# Patient Record
Sex: Female | Born: 1975 | Race: White | Hispanic: No | Smoking: Current every day smoker
Health system: Southern US, Community
[De-identification: ages and names within clinical notes are randomized; demographics above are authoritative.]

## PROBLEM LIST (undated history)

## (undated) DIAGNOSIS — N809 Endometriosis, unspecified: Secondary | ICD-10-CM

## (undated) DIAGNOSIS — F17200 Nicotine dependence, unspecified, uncomplicated: Secondary | ICD-10-CM

## (undated) DIAGNOSIS — N3010 Interstitial cystitis (chronic) without hematuria: Secondary | ICD-10-CM

## (undated) DIAGNOSIS — K589 Irritable bowel syndrome without diarrhea: Secondary | ICD-10-CM

## (undated) DIAGNOSIS — M797 Fibromyalgia: Secondary | ICD-10-CM

## (undated) HISTORY — PX: OTHER SURGICAL HISTORY: SHX169

## (undated) HISTORY — PX: TUBAL LIGATION: SHX77

---

## 2008-01-11 ENCOUNTER — Emergency Department (HOSPITAL_COMMUNITY): Admission: EM | Admit: 2008-01-11 | Discharge: 2008-01-11 | Payer: Self-pay | Admitting: Emergency Medicine

## 2008-01-13 ENCOUNTER — Emergency Department: Payer: Self-pay | Admitting: Emergency Medicine

## 2008-02-27 ENCOUNTER — Emergency Department: Payer: Self-pay | Admitting: Emergency Medicine

## 2008-03-13 ENCOUNTER — Emergency Department (HOSPITAL_COMMUNITY): Admission: EM | Admit: 2008-03-13 | Discharge: 2008-03-13 | Payer: Self-pay | Admitting: Emergency Medicine

## 2014-07-28 ENCOUNTER — Emergency Department (HOSPITAL_COMMUNITY)
Admission: EM | Admit: 2014-07-28 | Discharge: 2014-07-29 | Disposition: A | Discharge status: Home / Self Care | Payer: Self-pay | Attending: Emergency Medicine | Admitting: Emergency Medicine

## 2014-07-28 DIAGNOSIS — S199XXA Unspecified injury of neck, initial encounter: Secondary | ICD-10-CM | POA: Insufficient documentation

## 2014-07-28 DIAGNOSIS — S0101XA Laceration without foreign body of scalp, initial encounter: Secondary | ICD-10-CM | POA: Insufficient documentation

## 2014-07-28 DIAGNOSIS — S4992XA Unspecified injury of left shoulder and upper arm, initial encounter: Secondary | ICD-10-CM | POA: Insufficient documentation

## 2014-07-28 DIAGNOSIS — S060X1A Concussion with loss of consciousness of 30 minutes or less, initial encounter: Secondary | ICD-10-CM | POA: Insufficient documentation

## 2014-07-28 DIAGNOSIS — Z720 Tobacco use: Secondary | ICD-10-CM | POA: Insufficient documentation

## 2014-07-28 DIAGNOSIS — T7491XA Unspecified adult maltreatment, confirmed, initial encounter: Secondary | ICD-10-CM
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
## 2014-07-29 ENCOUNTER — Emergency Department (HOSPITAL_COMMUNITY): Payer: Self-pay

## 2014-07-29 ENCOUNTER — Encounter (HOSPITAL_COMMUNITY): Payer: Self-pay | Admitting: Emergency Medicine

## 2014-07-29 MED ORDER — LIDOCAINE-EPINEPHRINE 1 %-1:100000 IJ SOLN
10.0000 mL | Freq: Once | INTRAMUSCULAR | Status: DC
  Filled 2014-07-29: qty 1

## 2014-07-29 MED ORDER — CYCLOBENZAPRINE HCL 10 MG PO TABS: 10.0000 mg | ORAL_TABLET | Freq: Two times a day (BID) | ORAL | Status: DC | PRN

## 2014-07-29 MED ORDER — PROMETHAZINE HCL 25 MG PO TABS: 25.0000 mg | ORAL_TABLET | Freq: Four times a day (QID) | ORAL | Status: DC | PRN

## 2014-07-29 MED ORDER — HYDROCODONE-ACETAMINOPHEN 5-325 MG PO TABS: 2.0000 | ORAL_TABLET | ORAL | Status: DC | PRN

## 2014-07-29 MED ORDER — HYDROCODONE-ACETAMINOPHEN 5-325 MG PO TABS
2.0000 | ORAL_TABLET | Freq: Once | ORAL | Status: AC
  Administered 2014-07-29: 2 via ORAL
  Filled 2014-07-29: qty 2

## 2014-07-29 NOTE — Discharge Instructions (Signed)
Take Vicodin as needed for pain. Take flexeril as needed for muscle spasm. Take phenergan as needed for nausea. You may take these medications together. Refer to attached documents for more information. Return to the ED in 5 days for staple removal.
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
## 2014-07-29 NOTE — ED Notes (Signed)
PA at bedside.

## 2014-07-29 NOTE — ED Provider Notes (Signed)
Medical screening examination/treatment/procedure(s) were performed by non-physician practitioner and as supervising physician I was immediately available for consultation/collaboration.    Linwood DibblesJon Wise Fees, MD 07/29/14 [phone]

## 2014-08-15 ENCOUNTER — Encounter (HOSPITAL_COMMUNITY): Payer: Self-pay

## 2014-08-15 ENCOUNTER — Emergency Department (HOSPITAL_COMMUNITY)
Admission: EM | Admit: 2014-08-15 | Discharge: 2014-08-15 | Disposition: A | Discharge status: Home / Self Care | Payer: Self-pay | Attending: Emergency Medicine | Admitting: Emergency Medicine

## 2014-08-15 DIAGNOSIS — Z720 Tobacco use: Secondary | ICD-10-CM | POA: Insufficient documentation

## 2014-08-15 DIAGNOSIS — Z4802 Encounter for removal of sutures: Secondary | ICD-10-CM | POA: Insufficient documentation

## 2014-08-15 NOTE — ED Notes (Addendum)
Pt presents needing staples removed from posterior head.  Pain score 2/10.  Pt reports having staples placed on 10/15.

## 2014-08-15 NOTE — Discharge Instructions (Signed)
Sutures removed without difficulty.  Keep area clean and dry. Return to the ED for new or worsening symptoms.

## 2014-08-15 NOTE — ED Provider Notes (Signed)
CSN: [id number]     Arrival date & time 08/15/14  1605 History  This chart was scribed for non-physician practitioner Sharilyn SitesLisa [place] Droll working with Linwood DibblesJon Knapp, MD by Littie Deedsichard Sun, ED Scribe. This patient was seen in room WTR8/WTR8 and the patient's care was started at 4:19 PM.        Chief Complaint  Patient presents with  . Suture / Staple Removal     The history is provided by the patient. No language interpreter was used.   HPI Comments: Kristin Patterson is a 38 y.o. female who presents to the Emergency Department for staple removal from posterior head. Patient reports having staples placed on 07/28/14. They were supposed to be removed 5 days after placement, but she was unable to find a ride to the ED. No drainage or fever. She removed one of the staples herself PTA.  No current complaints other than itching at wound site.  History reviewed. No pertinent past medical history. History reviewed. No pertinent past surgical history. History reviewed. No pertinent family history. History  Substance Use Topics  . Smoking status: Current Every Day Smoker  . Smokeless tobacco: Not on file  . Alcohol Use: No   OB History    No data available     Review of Systems  Constitutional: Negative for fever.  Skin: Positive for wound.  All other systems reviewed and are negative.     Allergies  Review of patient's allergies indicates no known allergies.  Home Medications   Prior to Admission medications   Medication Sig Start Date End Date Taking? Authorizing Provider  Acetaminophen-Caff-Pyrilamine (MIDOL COMPLETE) 500-60-15 MG TABS Take 2 capsules by mouth every 4 (four) hours as needed (for period pain.).    Historical Provider, MD  Aspirin-Salicylamide-Caffeine (BC HEADACHE POWDER PO) Take 1 packet by mouth every 4 (four) hours as needed (for pain).    Historical Provider, MD  cyclobenzaprine (FLEXERIL) 10 MG tablet Take 1 tablet (10 mg total) by mouth 2 (two) times daily as needed for  muscle spasms. 07/29/14   Emilia BeckKaitlyn Szekalski, PA-C  HYDROcodone-acetaminophen (NORCO/VICODIN) 5-325 MG per tablet Take 2 tablets by mouth every 4 (four) hours as needed for moderate pain or severe pain. 07/29/14   Kaitlyn Szekalski, PA-C  ibuprofen (ADVIL,MOTRIN) 200 MG tablet Take 600-800 mg by mouth every 6 (six) hours as needed (for period pain/headaches).    Historical Provider, MD  promethazine (PHENERGAN) 25 MG tablet Take 1 tablet (25 mg total) by mouth every 6 (six) hours as needed for nausea or vomiting. 07/29/14   Kaitlyn Szekalski, PA-C   BP 115/97 mmHg  Pulse 83  Temp(Src) 98.3 F (36.8 C) (Oral)  Resp 16  SpO2 99%  LMP 08/14/2014   Physical Exam  Constitutional: She is oriented to person, place, and time. She appears well-developed and well-nourished. No distress.  HENT:  Head: Normocephalic.  Mouth/Throat: Oropharynx is clear and moist. No oropharyngeal exudate.  3 staples present in top of scalp (patient removed one herself prior to arrival in ED); wound appears clean without signs of infection  Eyes: Pupils are equal, round, and reactive to light.  Neck: Neck supple.  Cardiovascular: Normal rate.   Pulmonary/Chest: Effort normal and breath sounds normal. No respiratory distress. She has no wheezes.  Musculoskeletal: She exhibits no edema.  Neurological: She is alert and oriented to person, place, and time. No cranial nerve deficit.  Skin: Skin is warm and dry. No rash noted.  Psychiatric: She has a normal mood and  affect. Her behavior is normal.  Nursing note and vitals reviewed.   ED Course  Procedures  DIAGNOSTIC STUDIES: Oxygen Saturation is 99% on room air, normal by my interpretation.    COORDINATION OF CARE: 4:20 PM-Discussed treatment plan which includes staple removal with pt at bedside and pt agreed to plan.   SUTURE REMOVAL Performed by: PA Student Carys under my direct supervision Authorized by: Sharilyn SitesLisa Malaiya Paczkowski Consent: Verbal consent obtained. Consent  given by: patient Required items: required blood products, implants, devices, and special equipment available  Time out: Immediately prior to procedure a "time out" was called to verify the correct patient, procedure, equipment, support staff and site/side marked as required. Location: scalp Wound Appearance: clean Staples Removed: 3 (1 had been removed PTA) Post-removal: abx ointment Patient tolerance: Patient tolerated the procedure well with no immediate complications.   Labs Review Labs Reviewed - No data to display  Imaging Review No results found.   EKG Interpretation None      MDM   Final diagnoses:  Encounter for staple removal   3 staples in place, patient removed one herself prior to arrival.  Wound appears clean without signs of infection.  Sutures removed without difficulty, patient tolerated well. Will continue home pain medications as needed.  Discussed plan with patient, he/she acknowledged understanding and agreed with plan of care.  Return precautions given for new or worsening symptoms.  I personally performed the services described in this documentation, which was scribed in my presence. The recorded information has been reviewed and is accurate.  Garlon HatchetLisa M Alakai Macbride, PA-C 08/15/14 1732

## 2014-10-30 ENCOUNTER — Inpatient Hospital Stay (HOSPITAL_COMMUNITY)
Admission: EM | Admit: 2014-10-30 | Discharge: 2014-11-02 | DRG: 690 | Disposition: A | Discharge status: Home / Self Care | Payer: Self-pay | Attending: Family Medicine | Admitting: Family Medicine

## 2014-10-30 ENCOUNTER — Inpatient Hospital Stay (HOSPITAL_COMMUNITY): Payer: Self-pay

## 2014-10-30 ENCOUNTER — Encounter (HOSPITAL_COMMUNITY): Payer: Self-pay | Admitting: Emergency Medicine

## 2014-10-30 ENCOUNTER — Emergency Department (HOSPITAL_COMMUNITY): Payer: Self-pay

## 2014-10-30 DIAGNOSIS — N390 Urinary tract infection, site not specified: Secondary | ICD-10-CM | POA: Diagnosis present

## 2014-10-30 DIAGNOSIS — E876 Hypokalemia: Secondary | ICD-10-CM | POA: Diagnosis present

## 2014-10-30 DIAGNOSIS — F17210 Nicotine dependence, cigarettes, uncomplicated: Secondary | ICD-10-CM | POA: Diagnosis present

## 2014-10-30 DIAGNOSIS — R1013 Epigastric pain: Secondary | ICD-10-CM | POA: Diagnosis present

## 2014-10-30 DIAGNOSIS — D72829 Elevated white blood cell count, unspecified: Secondary | ICD-10-CM | POA: Diagnosis present

## 2014-10-30 DIAGNOSIS — N10 Acute tubulo-interstitial nephritis: Principal | ICD-10-CM | POA: Diagnosis present

## 2014-10-30 DIAGNOSIS — R6889 Other general symptoms and signs: Secondary | ICD-10-CM | POA: Diagnosis present

## 2014-10-30 DIAGNOSIS — N12 Tubulo-interstitial nephritis, not specified as acute or chronic: Secondary | ICD-10-CM

## 2014-10-30 DIAGNOSIS — M797 Fibromyalgia: Secondary | ICD-10-CM | POA: Diagnosis present

## 2014-10-30 DIAGNOSIS — R1084 Generalized abdominal pain: Secondary | ICD-10-CM

## 2014-10-30 DIAGNOSIS — F17200 Nicotine dependence, unspecified, uncomplicated: Secondary | ICD-10-CM | POA: Diagnosis present

## 2014-10-30 DIAGNOSIS — D649 Anemia, unspecified: Secondary | ICD-10-CM | POA: Clinically undetermined

## 2014-10-30 DIAGNOSIS — R1010 Upper abdominal pain, unspecified: Secondary | ICD-10-CM

## 2014-10-30 DIAGNOSIS — B9620 Unspecified Escherichia coli [E. coli] as the cause of diseases classified elsewhere: Secondary | ICD-10-CM | POA: Diagnosis present

## 2014-10-30 HISTORY — DX: Endometriosis, unspecified: N80.9

## 2014-10-30 HISTORY — DX: Nicotine dependence, unspecified, uncomplicated: F17.200

## 2014-10-30 HISTORY — DX: Fibromyalgia: M79.7

## 2014-10-30 HISTORY — DX: Interstitial cystitis (chronic) without hematuria: N30.10

## 2014-10-30 HISTORY — DX: Irritable bowel syndrome without diarrhea: K58.9

## 2014-10-30 LAB — URINE MICROSCOPIC-ADD ON

## 2014-10-30 LAB — I-STAT CHEM 8, ED
BUN: 15 mg/dL (ref 6–23)
CHLORIDE: 102 meq/L (ref 96–112)
CREATININE: 0.7 mg/dL (ref 0.50–1.10)
Calcium, Ion: 0.96 mmol/L — ABNORMAL LOW (ref 1.12–1.23)
GLUCOSE: 107 mg/dL — AB (ref 70–99)
HCT: 38 % (ref 36.0–46.0)
Hemoglobin: 12.9 g/dL (ref 12.0–15.0)
Potassium: 3 mmol/L — ABNORMAL LOW (ref 3.5–5.1)
Sodium: 138 mmol/L (ref 135–145)
TCO2: 19 mmol/L (ref 0–100)

## 2014-10-30 LAB — RAPID URINE DRUG SCREEN, HOSP PERFORMED
Amphetamines: NOT DETECTED
BENZODIAZEPINES: NOT DETECTED
Barbiturates: NOT DETECTED
COCAINE: NOT DETECTED
Opiates: POSITIVE — AB
TETRAHYDROCANNABINOL: POSITIVE — AB

## 2014-10-30 LAB — CBC WITH DIFFERENTIAL/PLATELET
Basophils Absolute: 0 10*3/uL (ref 0.0–0.1)
Basophils Relative: 0 % (ref 0–1)
Eosinophils Absolute: 0 10*3/uL (ref 0.0–0.7)
Eosinophils Relative: 0 % (ref 0–5)
HEMATOCRIT: 40.8 % (ref 36.0–46.0)
Hemoglobin: 14.3 g/dL (ref 12.0–15.0)
Lymphocytes Relative: 5 % — ABNORMAL LOW (ref 12–46)
Lymphs Abs: 1.4 10*3/uL (ref 0.7–4.0)
MCH: 33.6 pg (ref 26.0–34.0)
MCHC: 35 g/dL (ref 30.0–36.0)
MCV: 95.8 fL (ref 78.0–100.0)
MONO ABS: 1.1 10*3/uL — AB (ref 0.1–1.0)
Monocytes Relative: 4 % (ref 3–12)
Neutro Abs: 25.4 10*3/uL — ABNORMAL HIGH (ref 1.7–7.7)
Neutrophils Relative %: 91 % — ABNORMAL HIGH (ref 43–77)
Platelets: 207 10*3/uL (ref 150–400)
RBC: 4.26 MIL/uL (ref 3.87–5.11)
RDW: 13.6 % (ref 11.5–15.5)
WBC: 27.9 10*3/uL — ABNORMAL HIGH (ref 4.0–10.5)

## 2014-10-30 LAB — COMPREHENSIVE METABOLIC PANEL
ALBUMIN: 2.9 g/dL — AB (ref 3.5–5.2)
ALK PHOS: 227 U/L — AB (ref 39–117)
ALT: 40 U/L — ABNORMAL HIGH (ref 0–35)
ANION GAP: 12 (ref 5–15)
AST: 45 U/L — ABNORMAL HIGH (ref 0–37)
BILIRUBIN TOTAL: 1.9 mg/dL — AB (ref 0.3–1.2)
BUN: 16 mg/dL (ref 6–23)
CO2: 22 mmol/L (ref 19–32)
Calcium: 7.6 mg/dL — ABNORMAL LOW (ref 8.4–10.5)
Chloride: 105 mEq/L (ref 96–112)
Creatinine, Ser: 0.75 mg/dL (ref 0.50–1.10)
Glucose, Bld: 94 mg/dL (ref 70–99)
POTASSIUM: 3.6 mmol/L (ref 3.5–5.1)
SODIUM: 139 mmol/L (ref 135–145)
TOTAL PROTEIN: 6.8 g/dL (ref 6.0–8.3)

## 2014-10-30 LAB — MAGNESIUM: MAGNESIUM: 1.8 mg/dL (ref 1.5–2.5)

## 2014-10-30 LAB — URINALYSIS, ROUTINE W REFLEX MICROSCOPIC
Bilirubin Urine: NEGATIVE
GLUCOSE, UA: NEGATIVE mg/dL
Ketones, ur: NEGATIVE mg/dL
NITRITE: POSITIVE — AB
PH: 6 (ref 5.0–8.0)
Protein, ur: 30 mg/dL — AB
Specific Gravity, Urine: 1.012 (ref 1.005–1.030)
Urobilinogen, UA: 1 mg/dL (ref 0.0–1.0)

## 2014-10-30 LAB — POC URINE PREG, ED: Preg Test, Ur: NEGATIVE

## 2014-10-30 LAB — LIPASE, BLOOD: Lipase: 16 U/L (ref 11–59)

## 2014-10-30 MED ORDER — SODIUM CHLORIDE 0.9 % IV SOLN
INTRAVENOUS | Status: DC
  Administered 2014-10-30 – 2014-10-31 (×2): via INTRAVENOUS
  Administered 2014-11-01: 1000 mL via INTRAVENOUS

## 2014-10-30 MED ORDER — MORPHINE SULFATE 4 MG/ML IJ SOLN
4.0000 mg | Freq: Once | INTRAMUSCULAR | Status: AC
  Administered 2014-10-30: 4 mg via INTRAVENOUS
  Filled 2014-10-30: qty 1

## 2014-10-30 MED ORDER — POLYETHYLENE GLYCOL 3350 17 G PO PACK: 17.0000 g | PACK | Freq: Every day | ORAL | Status: DC | PRN

## 2014-10-30 MED ORDER — ONDANSETRON HCL 4 MG/2ML IJ SOLN
4.0000 mg | Freq: Once | INTRAMUSCULAR | Status: AC
  Administered 2014-10-30: 4 mg via INTRAVENOUS
  Filled 2014-10-30: qty 2

## 2014-10-30 MED ORDER — POTASSIUM CHLORIDE IN NACL 40-0.9 MEQ/L-% IV SOLN
INTRAVENOUS | Status: DC
  Administered 2014-10-30 – 2014-10-31 (×2): 125 mL/h via INTRAVENOUS
  Filled 2014-10-30 (×4): qty 1000

## 2014-10-30 MED ORDER — IOHEXOL 300 MG/ML  SOLN
100.0000 mL | Freq: Once | INTRAMUSCULAR | Status: AC | PRN
  Administered 2014-10-30: 100 mL via INTRAVENOUS

## 2014-10-30 MED ORDER — HYDROMORPHONE HCL 1 MG/ML IJ SOLN
1.0000 mg | Freq: Once | INTRAMUSCULAR | Status: AC
  Administered 2014-10-30: 1 mg via INTRAVENOUS
  Filled 2014-10-30: qty 1

## 2014-10-30 MED ORDER — IPRATROPIUM BROMIDE 0.02 % IN SOLN: 0.5000 mg | RESPIRATORY_TRACT | Status: DC | PRN

## 2014-10-30 MED ORDER — GI COCKTAIL ~~LOC~~
30.0000 mL | Freq: Three times a day (TID) | ORAL | Status: DC | PRN
  Filled 2014-10-30: qty 30

## 2014-10-30 MED ORDER — CEFTRIAXONE SODIUM IN DEXTROSE 20 MG/ML IV SOLN
1.0000 g | INTRAVENOUS | Status: DC
  Administered 2014-10-30 – 2014-11-01 (×3): 1 g via INTRAVENOUS
  Filled 2014-10-30 (×4): qty 50

## 2014-10-30 MED ORDER — GABAPENTIN 300 MG PO CAPS
600.0000 mg | ORAL_CAPSULE | Freq: Once | ORAL | Status: DC
  Filled 2014-10-30: qty 2

## 2014-10-30 MED ORDER — KETOROLAC TROMETHAMINE 30 MG/ML IJ SOLN
30.0000 mg | Freq: Once | INTRAMUSCULAR | Status: AC
  Administered 2014-10-30: 30 mg via INTRAVENOUS
  Filled 2014-10-30: qty 1

## 2014-10-30 MED ORDER — DM-GUAIFENESIN ER 30-600 MG PO TB12
2.0000 | ORAL_TABLET | Freq: Two times a day (BID) | ORAL | Status: DC
  Administered 2014-10-30 – 2014-11-02 (×7): 2 via ORAL
  Filled 2014-10-30 (×8): qty 2

## 2014-10-30 MED ORDER — HYDROCODONE-ACETAMINOPHEN 5-325 MG PO TABS
2.0000 | ORAL_TABLET | ORAL | Status: DC | PRN
  Administered 2014-10-30 – 2014-10-31 (×5): 2 via ORAL
  Filled 2014-10-30 (×5): qty 2

## 2014-10-30 MED ORDER — MAGNESIUM CITRATE PO SOLN: 1.0000 | Freq: Once | ORAL | Status: AC | PRN

## 2014-10-30 MED ORDER — IOHEXOL 300 MG/ML  SOLN
50.0000 mL | Freq: Once | INTRAMUSCULAR | Status: AC | PRN
  Administered 2014-10-30: 50 mL via ORAL

## 2014-10-30 MED ORDER — ACETAMINOPHEN 650 MG RE SUPP: 650.0000 mg | Freq: Four times a day (QID) | RECTAL | Status: DC | PRN

## 2014-10-30 MED ORDER — SODIUM CHLORIDE 0.9 % IV SOLN: 1000.0000 mL | Freq: Once | INTRAVENOUS | Status: DC

## 2014-10-30 MED ORDER — PROMETHAZINE HCL 25 MG/ML IJ SOLN
25.0000 mg | Freq: Once | INTRAMUSCULAR | Status: AC
  Administered 2014-10-30: 25 mg via INTRAVENOUS
  Filled 2014-10-30: qty 1

## 2014-10-30 MED ORDER — GUAIFENESIN-DM 100-10 MG/5ML PO SYRP
5.0000 mL | ORAL_SOLUTION | ORAL | Status: DC | PRN
Start: 2014-10-30 — End: 2014-11-02

## 2014-10-30 MED ORDER — ONDANSETRON HCL 4 MG PO TABS: 4.0000 mg | ORAL_TABLET | Freq: Four times a day (QID) | ORAL | Status: DC | PRN

## 2014-10-30 MED ORDER — DOCUSATE SODIUM 100 MG PO CAPS
100.0000 mg | ORAL_CAPSULE | Freq: Two times a day (BID) | ORAL | Status: DC
  Administered 2014-10-30 – 2014-11-02 (×5): 100 mg via ORAL
  Filled 2014-10-30 (×7): qty 1

## 2014-10-30 MED ORDER — SORBITOL 70 % SOLN: 30.0000 mL | Freq: Every day | Status: DC | PRN

## 2014-10-30 MED ORDER — NICOTINE 21 MG/24HR TD PT24
21.0000 mg | MEDICATED_PATCH | Freq: Every day | TRANSDERMAL | Status: DC
  Administered 2014-10-30 – 2014-11-02 (×4): 21 mg via TRANSDERMAL
  Filled 2014-10-30 (×6): qty 1

## 2014-10-30 MED ORDER — PSEUDOEPHEDRINE HCL ER 120 MG PO TB12
120.0000 mg | ORAL_TABLET | Freq: Two times a day (BID) | ORAL | Status: DC
  Administered 2014-10-30 – 2014-11-02 (×7): 120 mg via ORAL
  Filled 2014-10-30 (×8): qty 1

## 2014-10-30 MED ORDER — MORPHINE SULFATE 2 MG/ML IJ SOLN
2.0000 mg | INTRAMUSCULAR | Status: DC | PRN
  Administered 2014-10-30: 2 mg via INTRAVENOUS
  Filled 2014-10-30: qty 1

## 2014-10-30 MED ORDER — LORATADINE 10 MG PO TABS
10.0000 mg | ORAL_TABLET | Freq: Every day | ORAL | Status: DC
  Administered 2014-10-30 – 2014-11-02 (×4): 10 mg via ORAL
  Filled 2014-10-30 (×4): qty 1

## 2014-10-30 MED ORDER — ACETAMINOPHEN 325 MG PO TABS
650.0000 mg | ORAL_TABLET | Freq: Four times a day (QID) | ORAL | Status: DC | PRN
  Administered 2014-10-31 – 2014-11-01 (×4): 650 mg via ORAL
  Filled 2014-10-30 (×4): qty 2

## 2014-10-30 MED ORDER — CYCLOBENZAPRINE HCL 10 MG PO TABS
10.0000 mg | ORAL_TABLET | Freq: Two times a day (BID) | ORAL | Status: DC | PRN
  Administered 2014-10-31 – 2014-11-02 (×4): 10 mg via ORAL
  Filled 2014-10-30 (×4): qty 1

## 2014-10-30 MED ORDER — ALBUTEROL SULFATE (2.5 MG/3ML) 0.083% IN NEBU: 2.5000 mg | INHALATION_SOLUTION | RESPIRATORY_TRACT | Status: DC | PRN

## 2014-10-30 MED ORDER — SODIUM CHLORIDE 0.9 % IV SOLN
1000.0000 mL | Freq: Once | INTRAVENOUS | Status: AC
  Administered 2014-10-30: 1000 mL via INTRAVENOUS

## 2014-10-30 MED ORDER — DEXTROSE 5 % IV SOLN
1.0000 g | Freq: Once | INTRAVENOUS | Status: AC
  Administered 2014-10-30: 1 g via INTRAVENOUS
  Filled 2014-10-30: qty 10

## 2014-10-30 MED ORDER — POTASSIUM CHLORIDE CRYS ER 20 MEQ PO TBCR
40.0000 meq | EXTENDED_RELEASE_TABLET | ORAL | Status: AC
  Administered 2014-10-30 (×2): 40 meq via ORAL
  Filled 2014-10-30 (×3): qty 2

## 2014-10-30 MED ORDER — ONDANSETRON HCL 4 MG/2ML IJ SOLN
4.0000 mg | Freq: Four times a day (QID) | INTRAMUSCULAR | Status: DC | PRN
  Administered 2014-10-30 – 2014-10-31 (×2): 4 mg via INTRAVENOUS
  Filled 2014-10-30 (×2): qty 2

## 2014-10-30 MED ORDER — ENOXAPARIN SODIUM 40 MG/0.4ML ~~LOC~~ SOLN
40.0000 mg | Freq: Every day | SUBCUTANEOUS | Status: DC
  Administered 2014-10-30: 40 mg via SUBCUTANEOUS
  Filled 2014-10-30: qty 0.4

## 2014-10-30 NOTE — ED Notes (Signed)
Pts s/o come to desk to state pt is still nauseated and having pain. Writer spoke with Dr Elesa MassedWard who ordered additional meds. Pt watching TV and talking with s/o stating she is not getting relief with the meds given. Pt also states she continues to be nauseated.

## 2014-10-30 NOTE — ED Provider Notes (Signed)
TIME SEEN: 9:30 AM  CHIEF COMPLAINT: Flulike symptoms, abdominal pain, nausea, vomiting, diarrhea  HPI: Pt is a 39 y.o. female with history of fibromyalgia, endometriosis, interstitial cystitis who presents to the emergency department with complaints of 6 days of subjective fevers, chills, productive cough, body aches. She reports that she began having right-sided abdominal pain 5 days ago that is now diffusely across the upper abdomen. No aggravating or relieving factors. She has had nausea, vomiting and diarrhea. No sick contacts or recent travel. She did not have an influenza vaccination this year. She states this is causing her "fibromyalgia to flareup". Last menstrual period was December 24. She then had 3 days of vaginal bleeding that has now resolved last week. No dysuria or hematuria.  ROS: See HPI Constitutional: no fever  Eyes: no drainage  ENT: no runny nose   Cardiovascular:  no chest pain  Resp: no SOB  GI: vomiting GU: no dysuria Integumentary: no rash  Allergy: no hives  Musculoskeletal: no leg swelling  Neurological: no slurred speech ROS otherwise negative  PAST MEDICAL HISTORY/PAST SURGICAL HISTORY:  Past Medical History  Diagnosis Date  . Fibromyalgia   . Endometriosis     MEDICATIONS:  Prior to Admission medications   Medication Sig Start Date End Date Taking? Authorizing Provider  acetaminophen (TYLENOL) 500 MG tablet Take 500 mg by mouth every 6 (six) hours as needed for mild pain.   Yes Historical Provider, MD  ibuprofen (ADVIL,MOTRIN) 200 MG tablet Take 600-800 mg by mouth every 6 (six) hours as needed (for period pain/headaches).   Yes Historical Provider, MD  pseudoephedrine-acetaminophen (TYLENOL SINUS) 30-500 MG TABS Take 1 tablet by mouth every 4 (four) hours as needed (cold symptoms).   Yes Historical Provider, MD  cyclobenzaprine (FLEXERIL) 10 MG tablet Take 1 tablet (10 mg total) by mouth 2 (two) times daily as needed for muscle spasms. 07/29/14    Emilia BeckKaitlyn Szekalski, PA-C  HYDROcodone-acetaminophen (NORCO/VICODIN) 5-325 MG per tablet Take 2 tablets by mouth every 4 (four) hours as needed for moderate pain or severe pain. 07/29/14   Kaitlyn Szekalski, PA-C  promethazine (PHENERGAN) 25 MG tablet Take 1 tablet (25 mg total) by mouth every 6 (six) hours as needed for nausea or vomiting. 07/29/14   Emilia BeckKaitlyn Szekalski, PA-C    ALLERGIES:  No Known Allergies  SOCIAL HISTORY:  History  Substance Use Topics  . Smoking status: Current Every Day Smoker    Types: Cigarettes  . Smokeless tobacco: Not on file  . Alcohol Use: Yes    FAMILY HISTORY: No family history on file.  EXAM: BP 120/77 mmHg  Pulse 94  Temp(Src) 98.4 F (36.9 C) (Oral)  Resp 17  SpO2 99%  LMP 10/06/2014 CONSTITUTIONAL: Alert and oriented and responds appropriately to questions. Well-appearing; well-nourished HEAD: Normocephalic EYES: Conjunctivae clear, PERRL ENT: normal nose; no rhinorrhea; moist mucous membranes; pharynx without lesions noted, no tonsillar hypertrophy or exudate, no trismus or drooling, no dental caries are abscess, TMs are clear bilaterally NECK: Supple, no meningismus, no LAD  CARD: RRR; S1 and S2 appreciated; no murmurs, no clicks, no rubs, no gallops RESP: Normal chest excursion without splinting or tachypnea; breath sounds clear and equal bilaterally; no wheezes, no rhonchi, no rales, no hypoxia or respiratory distress ABD/GI: Normal bowel sounds; non-distended; soft, diffusely tender throughout the upper abdomen with voluntary guarding, no rebound, no tenderness at McBurney's point, no pelvic tenderness on exam BACK:  The back appears normal and is non-tender to palpation, there is no  CVA tenderness EXT: Normal ROM in all joints; non-tender to palpation; no edema; normal capillary refill; no cyanosis    SKIN: Normal color for age and race; warm NEURO: Moves all extremities equally PSYCH: The patient's mood and manner are appropriate.  Grooming and personal hygiene are appropriate.  MEDICAL DECISION MAKING: Patient here with complaints of flulike symptoms, nausea, vomiting and diarrhea and abdominal pain. She is diffusely tender to palpation throughout her upper abdomen. We'll obtain labs, urine and a CT of her abdomen and pelvis. We'll give IV fluids, pain and nausea medicine.  ED PROGRESS: Patient has a leukocytosis of 27.9 with left shift. She is also mildly hyponatremic, hypo-[place]. Mild elevation of LFTs. She is a nitrite-positive UTI with many bacteria. Culture pending. Urine pregnancy negative. CT scan shows bilateral pyelonephritis without hydronephrosis. Creatinine today is 0.75. No sign of kidney stone. Patient's pain is not well controlled after 3 rounds of IV narcotics, Zofran and Phenergan. I feel she will need admission for IV antibiotics, IV hydration, symptom control. She does not have a primary care physician. We'll discuss with hospitalist for admission. She is hemodynamically stable.   11:45 AM  D/w Dr. Janee Morn for admission to medical, inpatient bed. I will place holding orders.  Layla Maw Nicholas Trompeter, DO 10/30/14 1147

## 2014-10-30 NOTE — ED Notes (Signed)
I have just given [place] [address]--will transport shortly.  She remains in no distress.

## 2014-10-30 NOTE — ED Notes (Signed)
She is in no distress and is drinking her CT contrast.

## 2014-10-30 NOTE — H&P (Signed)
[facility] History and Physical  CHRISANDRA WIEMERS ZOX:[id number] DOB: 08-11-1976 DOA: 10/30/2014  Referring physician: Dr. Elesa Massed PCP: No PCP Per Patient   Chief Complaint: flu like symptoms, back pain.  HPI: Kristin Patterson is a 39 y.o. female  With history of fibromyalgia, endometriosis, interstitial cystitis, IBS who presents to the ED with a five-day history of flulike symptoms encompassing fevers, chills, nausea, 2 episodes of emesis, productive cough, sore throat and right-sided back pain. Patient states these symptoms have flared up her fibromyalgia. Patient also endorses some generalized weakness. Patient denies any chest pain, no shortness of breath, no dysuria, no constipation, no melena, no hematemesis, no hematochezia. Patient does endorse watery stools would last one on the day of admission. Patient was seen in the emergency room urinalysis done was consistent with a UTI. CT of the abdomen and pelvis which was done was consistent with bilateral palatal nephritis without hydronephrosis. Patient was given IV Rocephin. [facility] were consulted to admit the patient for further evaluation and management.   Review of Systems: As per history of present illness otherwise negative. Constitutional:  No weight loss, night sweats, Fevers, chills, fatigue.  HEENT:  No headaches, Difficulty swallowing,Tooth/dental problems,Sore throat,  No sneezing, itching, ear ache, nasal congestion, post nasal drip,  Cardio-vascular:  No chest pain, Orthopnea, PND, swelling in lower extremities, anasarca, dizziness, palpitations  GI:  No heartburn, indigestion, abdominal pain, nausea, vomiting, diarrhea, change in bowel habits, loss of appetite  Resp:  No shortness of breath with exertion or at rest. No excess mucus, no productive cough, No non-productive cough, No coughing up of blood.No change in color of mucus.No wheezing.No chest wall deformity  Skin:  no rash or lesions.  GU:  no  dysuria, change in color of urine, no urgency or frequency. No flank pain.  Musculoskeletal:  No joint pain or swelling. No decreased range of motion. No back pain.  Psych:  No change in mood or affect. No depression or anxiety. No memory loss.   Past Medical History  Diagnosis Date  . Fibromyalgia   . Endometriosis   . Interstitial cystitis   . Irritable bowel syndrome (IBS)   . Tobacco dependence 10/30/2014   Past Surgical History  Procedure Laterality Date  . Endometreosis    . Tubal ligation     Social History:  reports that she has been smoking Cigarettes.  She has been smoking about 1.50 packs per day. She does not have any smokeless tobacco history on file. She reports that she drinks alcohol. She reports that she does not use illicit drugs.  No Known Allergies  History reviewed. No pertinent family history.   Prior to Admission medications   Medication Sig Start Date End Date Taking? Authorizing Provider  acetaminophen (TYLENOL) 500 MG tablet Take 500 mg by mouth every 6 (six) hours as needed for mild pain.   Yes Historical Provider, MD  ibuprofen (ADVIL,MOTRIN) 200 MG tablet Take 600-800 mg by mouth every 6 (six) hours as needed (for period pain/headaches).   Yes Historical Provider, MD  pseudoephedrine-acetaminophen (TYLENOL SINUS) 30-500 MG TABS Take 1 tablet by mouth every 4 (four) hours as needed (cold symptoms).   Yes Historical Provider, MD  cyclobenzaprine (FLEXERIL) 10 MG tablet Take 1 tablet (10 mg total) by mouth 2 (two) times daily as needed for muscle spasms. 07/29/14   Emilia Beck, PA-C  HYDROcodone-acetaminophen (NORCO/VICODIN) 5-325 MG per tablet Take 2 tablets by mouth every 4 (four) hours as needed for moderate pain  or severe pain. 07/29/14   Kaitlyn Szekalski, PA-C  promethazine (PHENERGAN) 25 MG tablet Take 1 tablet (25 mg total) by mouth every 6 (six) hours as needed for nausea or vomiting. 07/29/14   Emilia Beck, PA-C   Physical Exam: Filed  Vitals:   10/30/14 0903  BP: 120/77  Pulse: 94  Temp: 98.4 F (36.9 C)  TempSrc: Oral  Resp: 17  SpO2: 99%    Wt Readings from Last 3 Encounters:  No data found for Wt    General:  Well-developed well-nourished female in no acute cardiopulmonary distress. Speaking in full sentences.  Eyes: PERRLA, EOMI, normal lids, irises & conjunctiva ENT: grossly normal hearing, lips & tongue. Dry mucous membranes. Neck: no LAD, masses or thyromegaly Cardiovascular: RRR, no m/r/g. No LE edema. Respiratory: CTA bilaterally, no w/r/r. Normal respiratory effort. Abdomen: soft, nondistended, positive bowel sounds, bilateral CVA tenderness left greater than right, no rebound, no guarding. Skin: no rash or induration seen on limited exam Musculoskeletal: grossly normal tone BUE/BLE Psychiatric: grossly normal mood and affect, speech fluent and appropriate Neurologic: Alert and oriented 3. Cranial nerves II through XII are grossly intact. No focal deficits.           Labs on Admission:  Basic Metabolic Panel:  Recent Labs Lab 10/30/14 0912 10/30/14 1219  NA 139 138  K 3.6 3.0*  CL 105 102  CO2 22  --   GLUCOSE 94 107*  BUN 16 15  CREATININE 0.75 0.70  CALCIUM 7.6*  --    Liver Function Tests:  Recent Labs Lab 10/30/14 0912  AST 45*  ALT 40*  ALKPHOS 227*  BILITOT 1.9*  PROT 6.8  ALBUMIN 2.9*    Recent Labs Lab 10/30/14 0912  LIPASE 16   No results for input(s): AMMONIA in the last 168 hours. CBC:  Recent Labs Lab 10/30/14 0912 10/30/14 1219  WBC 27.9*  --   NEUTROABS 25.4*  --   HGB 14.3 12.9  HCT 40.8 38.0  MCV 95.8  --   PLT 207  --    Cardiac Enzymes: No results for input(s): CKTOTAL, CKMB, CKMBINDEX, TROPONINI in the last 168 hours.  BNP (last 3 results) No results for input(s): PROBNP in the last 8760 hours. CBG: No results for input(s): GLUCAP in the last 168 hours.  Radiological Exams on Admission: Ct Abdomen Pelvis W Contrast  10/30/2014    CLINICAL DATA:  Right-sided back pain and elevated white blood cell count. Flu-like symptoms since Monday.  EXAM: CT ABDOMEN AND PELVIS WITH CONTRAST  TECHNIQUE: Multidetector CT imaging of the abdomen and pelvis was performed using the standard protocol following bolus administration of intravenous contrast.  CONTRAST:  50mL OMNIPAQUE IOHEXOL 300 MG/ML SOLN, OMNIPAQUE IOHEXOL 300 MG/ML SOLN  COMPARISON:  None.  FINDINGS: Lung bases are clear.  No evidence for free air.  Punctate hypodensity at the hepatic dome is nonspecific but likely represents a benign etiology such as a cyst. Otherwise, normal appearance of the liver, portal venous system and gallbladder. Normal appearance of the spleen, pancreas and adrenal glands.  There is mild stranding along the superior left kidney. The enhancement pattern in both kidneys is mildly heterogeneous. This is best seen along the right kidney lower pole on sequence 5, image 34. There is also mild heterogeneity along the left kidney lower pole, best seen on sequence 5, image 44. There is a small exophytic intermediate density lesion in the right kidney upper pole, that roughly measures 1 cm. This  is not compatible with a simple cyst. No evidence for hydronephrosis.  Small amount of free fluid in the pelvis may be physiologic. No gross abnormality to the uterus or ovarian tissue. Urinary bladder is decompressed. Normal appearance of the appendix. The small and large bowel are unremarkable. No significant abdominal or pelvic lymphadenopathy.  No acute bone abnormality.  IMPRESSION: Heterogeneous appearance of both kidneys with mild stranding around the left kidney. Findings are suggestive for acute pyelonephritis bilaterally. Negative for hydronephrosis.  There is an indeterminate 1 cm structure along the right kidney upper pole. This could represent a complex cyst or even a small abscess. Recommend follow up ultrasound after appropriate antimicrobial therapy. If this area  persists, consider further evaluation with MRI for better characterization.   Electronically Signed   By: Richarda OverlieAdam  Henn M.D.   On: 10/30/2014 11:06    EKG: None  Assessment/Plan Principal Problem:   Acute pyelonephritis: Bilateral Active Problems:   Leukocytosis   Flu-like symptoms   UTI (lower urinary tract infection)   Hypokalemia   Tobacco dependence  #1 bilateral acute pyelonephritis/urinary tract infection Patient with CVA tenderness urinalysis consistent with a UTI. CT abdomen and pelvis consistent with bilateral pyelonephritis. Urine cultures are pending. Check blood cultures 2. Placed empirically on IV Rocephin. IV fluids. Anti-emetics. Pain management. Supportive care.  #2 leukocytosis Likely secondary to problem #1. Urine cultures are pending. Check blood cultures 2. Check a chest x-ray as patient had some nausea and vomiting to rule out aspiration pneumonia. Placed empirically on IV Rocephin. Follow.  #3 flulike symptoms Will check an influenza panel. Symptomatic treatment. Supportive care.  #4 hypokalemia Likely secondary to GI losses. Check a magnesium level. Replete.  #5 tobacco dependence Tobacco cessation. Nicotine patch.  #6 prophylaxis PPI for GI prophylaxis. Lovenox for DVT prophylaxis.  Code Status: Full DVT Prophylaxis: Lovenox Family Communication: Updated patient and boyfriend at bedside. Disposition Plan: Admit to MedSurg.  Time spent: 65 minutes  Dwight D. Eisenhower Va Medical CenterHOMPSON,DANIEL MD [facility] Pager [phone]

## 2014-10-30 NOTE — ED Notes (Signed)
Pt c/o flu like symptoms since last Monday. Pt states that she has had fevers, nausea, cough with phlegm, body aches that has flared up her fibromyalgia.  Pt states that she has had ride side pain that radiates down her lower back and buttock area.  Pt states that this am she has been having n/v/d.

## 2014-10-31 LAB — COMPREHENSIVE METABOLIC PANEL
ALBUMIN: 2.6 g/dL — AB (ref 3.5–5.2)
ALT: 31 U/L (ref 0–35)
ANION GAP: 8 (ref 5–15)
AST: 25 U/L (ref 0–37)
Alkaline Phosphatase: 196 U/L — ABNORMAL HIGH (ref 39–117)
BILIRUBIN TOTAL: 0.9 mg/dL (ref 0.3–1.2)
BUN: 13 mg/dL (ref 6–23)
CO2: 23 mmol/L (ref 19–32)
Calcium: 7.3 mg/dL — ABNORMAL LOW (ref 8.4–10.5)
Chloride: 110 mEq/L (ref 96–112)
Creatinine, Ser: 0.8 mg/dL (ref 0.50–1.10)
GFR calc Af Amer: >90 mL/min (ref >90)
Glucose, Bld: 82 mg/dL (ref 70–99)
Potassium: 4 mmol/L (ref 3.5–5.1)
Sodium: 141 mmol/L (ref 135–145)
Total Protein: 6.5 g/dL (ref 6.0–8.3)

## 2014-10-31 LAB — HIV ANTIBODY (ROUTINE TESTING W REFLEX)
HIV 1/O/2 Abs-Index Value: <1 (ref <1.00)
HIV-1/HIV-2 Ab: NONREACTIVE

## 2014-10-31 LAB — CBC WITH DIFFERENTIAL/PLATELET
BASOS PCT: 0 % (ref 0–1)
Basophils Absolute: 0 10*3/uL (ref 0.0–0.1)
EOS PCT: 1 % (ref 0–5)
Eosinophils Absolute: 0.1 10*3/uL (ref 0.0–0.7)
HCT: 36.8 % (ref 36.0–46.0)
Hemoglobin: 12 g/dL (ref 12.0–15.0)
Lymphocytes Relative: 10 % — ABNORMAL LOW (ref 12–46)
Lymphs Abs: 1.9 10*3/uL (ref 0.7–4.0)
MCH: 32.3 pg (ref 26.0–34.0)
MCHC: 32.6 g/dL (ref 30.0–36.0)
MCV: 99.2 fL (ref 78.0–100.0)
MONO ABS: 1 10*3/uL (ref 0.1–1.0)
Monocytes Relative: 5 % (ref 3–12)
Neutro Abs: 16.8 10*3/uL — ABNORMAL HIGH (ref 1.7–7.7)
Neutrophils Relative %: 85 % — ABNORMAL HIGH (ref 43–77)
Platelets: 204 10*3/uL (ref 150–400)
RBC: 3.71 MIL/uL — ABNORMAL LOW (ref 3.87–5.11)
RDW: 14 % (ref 11.5–15.5)
WBC: 19.9 10*3/uL — ABNORMAL HIGH (ref 4.0–10.5)

## 2014-10-31 LAB — INFLUENZA PANEL BY PCR (TYPE A & B)
H1N1 flu by pcr: NOT DETECTED
INFLAPCR: NEGATIVE
INFLBPCR: NEGATIVE

## 2014-10-31 LAB — CLOSTRIDIUM DIFFICILE BY PCR: Toxigenic C. Difficile by PCR: NEGATIVE

## 2014-10-31 MED ORDER — GABAPENTIN 600 MG PO TABS: 600.0000 mg | ORAL_TABLET | Freq: Three times a day (TID) | ORAL | Status: DC

## 2014-10-31 MED ORDER — PANTOPRAZOLE SODIUM 40 MG PO TBEC
40.0000 mg | DELAYED_RELEASE_TABLET | Freq: Every day | ORAL | Status: DC
  Administered 2014-10-31 – 2014-11-02 (×3): 40 mg via ORAL
  Filled 2014-10-31 (×5): qty 1

## 2014-10-31 MED ORDER — HYDROMORPHONE HCL 1 MG/ML IJ SOLN
1.0000 mg | INTRAMUSCULAR | Status: DC | PRN
  Administered 2014-10-31 – 2014-11-02 (×5): 1 mg via INTRAVENOUS
  Filled 2014-10-31 (×5): qty 1

## 2014-10-31 MED ORDER — GABAPENTIN 300 MG PO CAPS
600.0000 mg | ORAL_CAPSULE | Freq: Three times a day (TID) | ORAL | Status: DC
  Administered 2014-10-31 – 2014-11-02 (×7): 600 mg via ORAL
  Filled 2014-10-31 (×9): qty 2

## 2014-10-31 MED ORDER — OXYCODONE HCL 5 MG PO TABS
5.0000 mg | ORAL_TABLET | ORAL | Status: DC | PRN
Start: 2014-10-31 — End: 2014-11-02
  Administered 2014-10-31 – 2014-11-01 (×5): 10 mg via ORAL
  Administered 2014-11-01: 5 mg via ORAL
  Administered 2014-11-01 – 2014-11-02 (×5): 10 mg via ORAL
  Filled 2014-10-31 (×11): qty 2

## 2014-10-31 NOTE — Progress Notes (Signed)
[facility] PROGRESS NOTE  Kristin Patterson:[id number] DOB: 07-25-1976 DOA: 10/30/2014 PCP: No PCP Per Patient  Assessment/Plan: #1 acute bilateral pyelonephritis Patient still with pain. No nausea no vomiting. Currently afebrile. CBC trending down. Urine cultures are pending. Continue IV fluids. Continue IV Rocephin, antiemetics, pain management. Supportive care.  #2 UTI Urine cultures pending. Continue IV Rocephin.  #3 leukocytosis Likely secondary to problem #1 and 2. Blood cultures pending. Chest x-ray is negative. WBC trending down. Continue empiric IV Rocephin. Follow.  #4 flulike symptoms Influenza panel PCR pending. Symptomatic treatment. Supportive care.  #5 hypokalemia Repleted.  #6 tobacco dependence Tobacco cessation. Continue nicotine patch.  #7 fibromyalgia Will place on Neurontin which patient states has helped before in the past.  #8 prophylaxis PPI for GI prophylaxis. SCDs for DVT prophylaxis.  Code Status: Full Family Communication: Updated patient no family at bedside. Disposition Plan: Remain inpatient.   Consultants:  None  Procedures:  CT abdomen and pelvis 10/30/2014  Chest x-ray 10/30/2014  Antibiotics:  IV Rocephin 10/30/2014  HPI/Subjective: Patient complain of right-sided pain below the right breast underneath the ribs. Patient denies any emesis. She states she is tolerating current regular diet. Patient complaining of diffuse body aches secondary to her fibromyalgia.  Objective: Filed Vitals:   10/31/14 0528  BP: 112/73  Pulse: 85  Temp: 97.6 F (36.4 C)  Resp: 16   No intake or output data in the 24 hours ending 10/31/14 1053 Filed Weights   10/30/14 1328 10/31/14 0641  Weight: 64.139 kg (141 lb 6.4 oz) 65.318 kg (144 lb)    Exam:   General:  NAD  Cardiovascular: RRR  Respiratory: CTAB  Abdomen: Soft, tenderness to palpation in the epigastrium, nondistended, positive bowel sounds. Right CVA  tenderness.  Musculoskeletal: No clubbing cyanosis or edema.  Data Reviewed: Basic Metabolic Panel:  Recent Labs Lab 10/30/14 0912 10/30/14 1207 10/30/14 1219 10/31/14 0506  NA 139  --  138 141  K 3.6  --  3.0* 4.0  CL 105  --  102 110  CO2 22  --   --  23  GLUCOSE 94  --  107* 82  BUN 16  --  15 13  CREATININE 0.75  --  0.70 0.80  CALCIUM 7.6*  --   --  7.3*  MG  --  1.8  --   --    Liver Function Tests:  Recent Labs Lab 10/30/14 0912 10/31/14 0506  AST 45* 25  ALT 40* 31  ALKPHOS 227* 196*  BILITOT 1.9* 0.9  PROT 6.8 6.5  ALBUMIN 2.9* 2.6*    Recent Labs Lab 10/30/14 0912  LIPASE 16   No results for input(s): AMMONIA in the last 168 hours. CBC:  Recent Labs Lab 10/30/14 0912 10/30/14 1219 10/31/14 0506  WBC 27.9*  --  19.9*  NEUTROABS 25.4*  --  16.8*  HGB 14.3 12.9 12.0  HCT 40.8 38.0 36.8  MCV 95.8  --  99.2  PLT 207  --  204   Cardiac Enzymes: No results for input(s): CKTOTAL, CKMB, CKMBINDEX, TROPONINI in the last 168 hours. BNP (last 3 results) No results for input(s): PROBNP in the last 8760 hours. CBG: No results for input(s): GLUCAP in the last 168 hours.  Recent Results (from the past 240 hour(s))  Culture, blood (routine x 2)     Status: None (Preliminary result)   Collection Time: 10/30/14 12:07 PM  Result Value Ref Range Status   Specimen Description BLOOD RIGHT ANTECUBITAL  Final   Special Requests BOTTLES DRAWN AEROBIC AND ANAEROBIC 5 CC EACH  Final   Culture   Final           BLOOD CULTURE RECEIVED NO GROWTH TO DATE CULTURE WILL BE HELD FOR 5 DAYS BEFORE ISSUING A FINAL NEGATIVE REPORT [facility]    Report Status PENDING  Incomplete  Culture, blood (routine x 2)     Status: None (Preliminary result)   Collection Time: 10/30/14 12:07 PM  Result Value Ref Range Status   Specimen Description BLOOD LEFT HAND  Final   Special Requests BOTTLES DRAWN AEROBIC AND ANAEROBIC 5 CC EACH  Final   Culture    Final           BLOOD CULTURE RECEIVED NO GROWTH TO DATE CULTURE WILL BE HELD FOR 5 DAYS BEFORE ISSUING A FINAL NEGATIVE REPORT Performed at [facility]    Report Status PENDING  Incomplete  Clostridium Difficile by PCR     Status: None   Collection Time: 10/31/14  5:15 AM  Result Value Ref Range Status   C difficile by pcr NEGATIVE NEGATIVE Final    Comment: [facility]     Studies: Dg Chest 2 View  10/30/2014   CLINICAL DATA:  Leukocytosis. Pt c/o flu like symptoms since last Monday. Pt states that she has had fevers, nausea, productive cough, body aches that has flared up her fibromyalgia.  EXAM: CHEST  2 VIEW  COMPARISON:  None.  FINDINGS: Normal sized heart. Clear lungs. Mild diffuse peribronchial thickening. Unremarkable bones.  IMPRESSION: Mild bronchitic changes.   Electronically Signed   By: Gordan PaymentSteve  Reid M.D.   On: 10/30/2014 12:52   Ct Abdomen Pelvis W Contrast  10/30/2014   CLINICAL DATA:  Right-sided back pain and elevated white blood cell count. Flu-like symptoms since Monday.  EXAM: CT ABDOMEN AND PELVIS WITH CONTRAST  TECHNIQUE: Multidetector CT imaging of the abdomen and pelvis was performed using the standard protocol following bolus administration of intravenous contrast.  CONTRAST:  50mL OMNIPAQUE IOHEXOL 300 MG/ML SOLN, 100mL OMNIPAQUE IOHEXOL 300 MG/ML SOLN  COMPARISON:  None.  FINDINGS: Lung bases are clear.  No evidence for free air.  Punctate hypodensity at the hepatic dome is nonspecific but likely represents a benign etiology such as a cyst. Otherwise, normal appearance of the liver, portal venous system and gallbladder. Normal appearance of the spleen, pancreas and adrenal glands.  There is mild stranding along the superior left kidney. The enhancement pattern in both kidneys is mildly heterogeneous. This is best seen along the right kidney lower pole on sequence 5, image 34. There is also mild heterogeneity along the left kidney lower pole,  best seen on sequence 5, image 44. There is a small exophytic intermediate density lesion in the right kidney upper pole, that roughly measures 1 cm. This is not compatible with a simple cyst. No evidence for hydronephrosis.  Small amount of free fluid in the pelvis may be physiologic. No gross abnormality to the uterus or ovarian tissue. Urinary bladder is decompressed. Normal appearance of the appendix. The small and large bowel are unremarkable. No significant abdominal or pelvic lymphadenopathy.  No acute bone abnormality.  IMPRESSION: Heterogeneous appearance of both kidneys with mild stranding around the left kidney. Findings are suggestive for acute pyelonephritis bilaterally. Negative for hydronephrosis.  There is an indeterminate 1 cm structure along the right kidney upper pole. This could represent a complex cyst or even a  small abscess. Recommend follow up ultrasound after appropriate antimicrobial therapy. If this area persists, consider further evaluation with MRI for better characterization.   Electronically Signed   By: Richarda Overlie M.D.   On: 10/30/2014 11:06    Scheduled Meds: . cefTRIAXone (ROCEPHIN)  IV  1 g Intravenous Q24H  . dextromethorphan-guaiFENesin  2 tablet Oral BID  . docusate sodium  100 mg Oral BID  . gabapentin  600 mg Oral Once  . gabapentin  600 mg Oral TID  . loratadine  10 mg Oral Daily  . nicotine  21 mg Transdermal Daily  . pseudoephedrine  120 mg Oral BID   Continuous Infusions: . sodium chloride 125 mL/hr at 10/30/14 1309    Principal Problem:   Acute pyelonephritis: Bilateral Active Problems:   Leukocytosis   Flu-like symptoms   UTI (lower urinary tract infection)   Hypokalemia   Tobacco dependence    Time spent: 35 mins    [facility] MD [facility] Pager [phone]. If 7PM-7AM, please contact night-coverage at [email or web address], password [place] Day 10/31/2014, 10:53 AM  LOS: 1 day

## 2014-11-01 DIAGNOSIS — D649 Anemia, unspecified: Secondary | ICD-10-CM

## 2014-11-01 LAB — URINE CULTURE

## 2014-11-01 LAB — BASIC METABOLIC PANEL
ANION GAP: 8 (ref 5–15)
BUN: 8 mg/dL (ref 6–23)
CO2: 21 mmol/L (ref 19–32)
Calcium: 7.5 mg/dL — ABNORMAL LOW (ref 8.4–10.5)
Chloride: 111 mEq/L (ref 96–112)
Creatinine, Ser: 0.82 mg/dL (ref 0.50–1.10)
GFR calc Af Amer: >90 mL/min (ref >90)
GFR, EST NON AFRICAN AMERICAN: 89 mL/min — AB (ref >90)
Glucose, Bld: 113 mg/dL — ABNORMAL HIGH (ref 70–99)
Potassium: 4 mmol/L (ref 3.5–5.1)
Sodium: 140 mmol/L (ref 135–145)

## 2014-11-01 LAB — CBC
HCT: 32.5 % — ABNORMAL LOW (ref 36.0–46.0)
Hemoglobin: 10.7 g/dL — ABNORMAL LOW (ref 12.0–15.0)
MCH: 32.6 pg (ref 26.0–34.0)
MCHC: 32.9 g/dL (ref 30.0–36.0)
MCV: 99.1 fL (ref 78.0–100.0)
PLATELETS: 214 10*3/uL (ref 150–400)
RBC: 3.28 MIL/uL — ABNORMAL LOW (ref 3.87–5.11)
RDW: 13.9 % (ref 11.5–15.5)
WBC: 11.8 10*3/uL — ABNORMAL HIGH (ref 4.0–10.5)

## 2014-11-01 LAB — LIPASE, BLOOD: Lipase: 14 U/L (ref 11–59)

## 2014-11-01 LAB — FOLATE: Folate: 8.9 ng/mL

## 2014-11-01 LAB — IRON AND TIBC: UIBC: 186 ug/dL (ref 125–400)

## 2014-11-01 LAB — FERRITIN: Ferritin: 287 ng/mL (ref 10–291)

## 2014-11-01 LAB — GC/CHLAMYDIA PROBE AMP (~~LOC~~) NOT AT ARMC
Chlamydia: NEGATIVE
Neisseria Gonorrhea: NEGATIVE

## 2014-11-01 LAB — VITAMIN B12: Vitamin B-12: 1866 pg/mL — ABNORMAL HIGH (ref 211–911)

## 2014-11-01 LAB — RPR: RPR Ser Ql: NONREACTIVE

## 2014-11-01 MED ORDER — POLYSACCHARIDE IRON COMPLEX 150 MG PO CAPS
150.0000 mg | ORAL_CAPSULE | Freq: Every day | ORAL | Status: DC
Start: 2014-11-01 — End: 2014-11-02
  Administered 2014-11-01 – 2014-11-02 (×2): 150 mg via ORAL
  Filled 2014-11-01 (×3): qty 1

## 2014-11-01 MED ORDER — SODIUM CHLORIDE 0.9 % IV SOLN
INTRAVENOUS | Status: DC
  Administered 2014-11-01 – 2014-11-02 (×2): via INTRAVENOUS

## 2014-11-01 NOTE — Care Management Note (Signed)
CARE MANAGEMENT NOTE 11/01/2014  Patient:  TACIA, HINDLEY   Account Number:  [id number]  Date Initiated:  11/01/2014  Documentation initiated by:  Marney Doctor  Subjective/Objective Assessment:   39 yo admitted with Acute pyelonephritis     Action/Plan:   From home with significant other.   Anticipated DC Date:  11/03/2014   Anticipated DC Plan:  [place]  PCP issues  CM consult  Medication Assistance      Choice offered to / List presented to:             Status of service:  In process, will continue to follow Medicare Important Message given?   (If response is "NO", the following Medicare IM given date fields will be blank) Date Medicare IM given:   Medicare IM given by:   Date Additional Medicare IM given:   Additional Medicare IM given by:    Discharge Disposition:    Per UR Regulation:  Reviewed for med. necessity/level of care/duration of stay  If discussed at [place] of Stay Meetings, dates discussed:    Comments:  11/01/14 Marney Doctor RN,BSN,NCM [phone] Met with pt for PCP needs.  Pt states she does not have a PCP and does not have insurance.  Pt given info on [facility]-explained they can help her sign up for insurance and give her medication assistance when she leaves the hospital.  CM will continue to follow and make pt a follow up appointment on day of DC.

## 2014-11-01 NOTE — Progress Notes (Signed)
Flu PCR negative. D/c'd droplet precautions.

## 2014-11-01 NOTE — Progress Notes (Addendum)
[facility] PROGRESS NOTE  Kristin Patterson WJX:[id number] DOB: [birth year] DOA: 10/30/2014 PCP: No PCP Per Patient  Assessment/Plan: #1 acute bilateral pyelonephritis Patient still with pain. No nausea no vomiting. Currently afebrile. CBC trending down. Urine cultures consistent with Escherichia coli. Sensitivities pending. Patient with some emesis and nausea. Will downgrade diet to a full liquid diet. Continue IV fluids. Continue IV Rocephin, antiemetics, pain management. Supportive care.  #2 Escherichia coli UTI Urine cultures consistent with Escherichia coli. Sensitivities pending. Continue IV Rocephin.  #3 leukocytosis Likely secondary to problem #1 and 2. Blood cultures pending. Chest x-ray is negative. WBC trending down. Urine cultures with Escherichia coli. Sensitivities pending. Continue empiric IV Rocephin. Follow.  #4 flulike symptoms Influenza panel PCR negative. Symptomatic treatment. Supportive care.  #5 hypokalemia Repleted.  #6 tobacco dependence Tobacco cessation. Continue nicotine patch.  #7 epigastric abdominal pain Likely secondary to acute infection. Check a lipase levels. Will downgrade diet to a full liquid diet as patient states was unable to tolerate regular diet yesterday with emesis.   #8 fibromyalgia Continuie Neurontin.  #9 anemia Likely iron deficiency anemia in a menstruating female versus dilutional. Check an anemia panel. Follow H&H.  #10 prophylaxis PPI for GI prophylaxis. SCDs for DVT prophylaxis.  Code Status: Full Family Communication: Updated patient no family at bedside. Disposition Plan: Remain inpatient.   Consultants:  None  Procedures:  CT abdomen and pelvis 10/30/2014  Chest x-ray 10/30/2014  Antibiotics:  IV Rocephin 10/30/2014  HPI/Subjective: Patient complain of right-sided pain below the right breast underneath the ribs. Patient states had nausea and emesis with regular diet. Patient states fibromyalgia pain  improving with neurontin.  Patient states pain is better controlled.   Objective: Filed Vitals:   11/01/14 0555  BP: 116/94  Pulse: 98  Temp: 99.1 F (37.3 C)  Resp: 16    Intake/Output Summary (Last 24 hours) at 11/01/14 0948 Last data filed at 11/01/14 0502  Gross per 24 hour  Intake 5790.42 ml  Output   2075 ml  Net 3715.42 ml   Filed Weights   10/30/14 1328 10/31/14 0641 11/01/14 0555  Weight: 64.139 kg (141 lb 6.4 oz) 65.318 kg (144 lb) 67.178 kg (148 lb 1.6 oz)    Exam:   General:  NAD  Cardiovascular: RRR  Respiratory: CTAB  Abdomen: Soft, tenderness to palpation in the epigastrium, nondistended, positive bowel sounds. Right CVA tenderness.  Musculoskeletal: No clubbing cyanosis or edema.  Data Reviewed: Basic Metabolic Panel:  Recent Labs Lab 10/30/14 0912 10/30/14 1207 10/30/14 1219 10/31/14 0506 11/01/14 0450  NA 139  --  138 141 140  K 3.6  --  3.0* 4.0 4.0  CL 105  --  102 110 111  CO2 22  --   --  23 21  GLUCOSE 94  --  107* 82 113*  BUN 16  --  CREATININE 0.75  --  0.70 0.80 0.82  CALCIUM 7.6*  --   --  7.3* 7.5*  MG  --  1.8  --   --   --    Liver Function Tests:  Recent Labs Lab 10/30/14 0912 10/31/14 0506  AST 45* 25  ALT 40* 31  ALKPHOS 227* 196*  BILITOT 1.9* 0.9  PROT 6.8 6.5  ALBUMIN 2.9* 2.6*    Recent Labs Lab 10/30/14 0912  LIPASE 16   No results for input(s): AMMONIA in the last 168 hours. CBC:  Recent Labs Lab 10/30/14 0912 10/30/14 1219 10/31/14 2130  11/01/14 0450  WBC 27.9*  --  19.9* 11.8*  NEUTROABS 25.4*  --  16.8*  --   HGB 14.3 12.9 12.0 10.7*  HCT 40.8 38.0 36.8 32.5*  MCV 95.8  --  99.2 99.1  PLT 207  --  204 214   Cardiac Enzymes: No results for input(s): CKTOTAL, CKMB, CKMBINDEX, TROPONINI in the last 168 hours. BNP (last 3 results) No results for input(s): PROBNP in the last 8760 hours. CBG: No results for input(s): GLUCAP in the last 168 hours.  Recent Results (from the  past 240 hour(s))  Urine culture     Status: None (Preliminary result)   Collection Time: 10/30/14 11:05 AM  Result Value Ref Range Status   Specimen Description URINE, CLEAN CATCH  Final   Special Requests NONE  Final   Colony Count   Final    >=100,000 COLONIES/ML Performed at [facility]    Culture   Final    ESCHERICHIA COLI Performed at [facility]    Report Status PENDING  Incomplete  Culture, blood (routine x 2)     Status: None (Preliminary result)   Collection Time: 10/30/14 12:07 PM  Result Value Ref Range Status   Specimen Description BLOOD RIGHT ANTECUBITAL  Final   Special Requests BOTTLES DRAWN AEROBIC AND ANAEROBIC 5 CC EACH  Final   Culture   Final           BLOOD CULTURE RECEIVED NO GROWTH TO DATE CULTURE WILL BE HELD FOR 5 DAYS BEFORE ISSUING A FINAL NEGATIVE REPORT Performed at [facility]    Report Status PENDING  Incomplete  Culture, blood (routine x 2)     Status: None (Preliminary result)   Collection Time: 10/30/14 12:07 PM  Result Value Ref Range Status   Specimen Description BLOOD LEFT HAND  Final   Special Requests BOTTLES DRAWN AEROBIC AND ANAEROBIC 5 CC EACH  Final   Culture   Final           BLOOD CULTURE RECEIVED NO GROWTH TO DATE CULTURE WILL BE HELD FOR 5 DAYS BEFORE ISSUING A FINAL NEGATIVE REPORT Performed at [facility]    Report Status PENDING  Incomplete  Clostridium Difficile by PCR     Status: None   Collection Time: 10/31/14  5:15 AM  Result Value Ref Range Status   C difficile by pcr NEGATIVE NEGATIVE Final    Comment: Performed [facility]     Studies: Dg Chest 2 View  10/30/2014   CLINICAL DATA:  Leukocytosis. Pt c/o flu like symptoms since last Monday. Pt states that she has had fevers, nausea, productive cough, body aches that has flared up her fibromyalgia.  EXAM: CHEST  2 VIEW  COMPARISON:  None.  FINDINGS: Normal sized heart. Clear lungs. Mild diffuse peribronchial thickening.  Unremarkable bones.  IMPRESSION: Mild bronchitic changes.   Electronically Signed   By: Gordan PaymentSteve  Reid M.D.   On: 10/30/2014 12:52   Ct Abdomen Pelvis W Contrast  10/30/2014   CLINICAL DATA:  Right-sided back pain and elevated white blood cell count. Flu-like symptoms since Monday.  EXAM: CT ABDOMEN AND PELVIS WITH CONTRAST  TECHNIQUE: Multidetector CT imaging of the abdomen and pelvis was performed using the standard protocol following bolus administration of intravenous contrast.  CONTRAST:  50mL OMNIPAQUE IOHEXOL 300 MG/ML SOLN, 100mL OMNIPAQUE IOHEXOL 300 MG/ML SOLN  COMPARISON:  None.  FINDINGS: Lung bases are clear.  No evidence for free air.  Punctate  hypodensity at the hepatic dome is nonspecific but likely represents a benign etiology such as a cyst. Otherwise, normal appearance of the liver, portal venous system and gallbladder. Normal appearance of the spleen, pancreas and adrenal glands.  There is mild stranding along the superior left kidney. The enhancement pattern in both kidneys is mildly heterogeneous. This is best seen along the right kidney lower pole on sequence 5, image 34. There is also mild heterogeneity along the left kidney lower pole, best seen on sequence 5, image 44. There is a small exophytic intermediate density lesion in the right kidney upper pole, that roughly measures 1 cm. This is not compatible with a simple cyst. No evidence for hydronephrosis.  Small amount of free fluid in the pelvis may be physiologic. No gross abnormality to the uterus or ovarian tissue. Urinary bladder is decompressed. Normal appearance of the appendix. The small and large bowel are unremarkable. No significant abdominal or pelvic lymphadenopathy.  No acute bone abnormality.  IMPRESSION: Heterogeneous appearance of both kidneys with mild stranding around the left kidney. Findings are suggestive for acute pyelonephritis bilaterally. Negative for hydronephrosis.  There is an indeterminate 1 cm structure along  the right kidney upper pole. This could represent a complex cyst or even a small abscess. Recommend follow up ultrasound after appropriate antimicrobial therapy. If this area persists, consider further evaluation with MRI for better characterization.   Electronically Signed   By: Richarda Overlie M.D.   On: 10/30/2014 11:06    Scheduled Meds: . cefTRIAXone (ROCEPHIN)  IV  1 g Intravenous Q24H  . dextromethorphan-guaiFENesin  2 tablet Oral BID  . docusate sodium  100 mg Oral BID  . gabapentin  600 mg Oral Once  . gabapentin  600 mg Oral TID  . loratadine  10 mg Oral Daily  . nicotine  21 mg Transdermal Daily  . pantoprazole  40 mg Oral Q0600  . pseudoephedrine  120 mg Oral BID   Continuous Infusions: . sodium chloride      Principal Problem:   Acute pyelonephritis: Bilateral Active Problems:   Leukocytosis   Flu-like symptoms   UTI (lower urinary tract infection)   Hypokalemia   Tobacco dependence   Anemia    Time spent: 35 mins    [facility] MD [facility] Pager [phone]. If 7PM-7AM, please contact night-coverage at [email or web address], password [facility] 11/01/2014, 9:48 AM  LOS: 2 days

## 2014-11-02 LAB — BASIC METABOLIC PANEL
ANION GAP: 9 (ref 5–15)
BUN: 7 mg/dL (ref 6–23)
CALCIUM: 7.9 mg/dL — AB (ref 8.4–10.5)
CO2: 27 mmol/L (ref 19–32)
Chloride: 105 mEq/L (ref 96–112)
Creatinine, Ser: 0.74 mg/dL (ref 0.50–1.10)
GFR calc Af Amer: >90 mL/min (ref >90)
Glucose, Bld: 103 mg/dL — ABNORMAL HIGH (ref 70–99)
Potassium: 3.3 mmol/L — ABNORMAL LOW (ref 3.5–5.1)
SODIUM: 141 mmol/L (ref 135–145)

## 2014-11-02 LAB — CBC
HEMATOCRIT: 37.9 % (ref 36.0–46.0)
HEMOGLOBIN: 12.2 g/dL (ref 12.0–15.0)
MCH: 32.1 pg (ref 26.0–34.0)
MCHC: 32.2 g/dL (ref 30.0–36.0)
MCV: 99.7 fL (ref 78.0–100.0)
Platelets: 296 10*3/uL (ref 150–400)
RBC: 3.8 MIL/uL — AB (ref 3.87–5.11)
RDW: 14 % (ref 11.5–15.5)
WBC: 9.7 10*3/uL (ref 4.0–10.5)

## 2014-11-02 MED ORDER — CEPHALEXIN 500 MG PO CAPS
500.0000 mg | ORAL_CAPSULE | Freq: Two times a day (BID) | ORAL | Status: DC
  Administered 2014-11-02: 500 mg via ORAL
  Filled 2014-11-02 (×2): qty 1

## 2014-11-02 MED ORDER — GABAPENTIN 300 MG PO CAPS: 600.0000 mg | ORAL_CAPSULE | Freq: Once | ORAL | Status: DC

## 2014-11-02 MED ORDER — CEPHALEXIN 500 MG PO CAPS: 500.0000 mg | ORAL_CAPSULE | Freq: Two times a day (BID) | ORAL | Status: DC

## 2014-11-02 MED ORDER — GABAPENTIN 600 MG PO TABS: 600.0000 mg | ORAL_TABLET | Freq: Every day | ORAL | Status: AC

## 2014-11-02 MED ORDER — OXYCODONE HCL 5 MG PO TABS: 10.0000 mg | ORAL_TABLET | Freq: Four times a day (QID) | ORAL | Status: DC | PRN

## 2014-11-02 MED ORDER — NICOTINE 21 MG/24HR TD PT24: 21.0000 mg | MEDICATED_PATCH | Freq: Every day | TRANSDERMAL | Status: DC

## 2014-11-02 NOTE — Care Management Note (Signed)
CARE MANAGEMENT NOTE 11/02/2014  Patient:  Kristin Patterson, Kristin Patterson   Account Number:  [id number]  Date Initiated:  11/01/2014  Documentation initiated by:  Marney Doctor  Subjective/Objective Assessment:   39 yo admitted with Acute pyelonephritis     Action/Plan:   From home with significant other.   Anticipated DC Date:  11/02/2014   Anticipated DC Plan:  [place]  PCP issues  CM consult  Medication Assistance      Choice offered to / List presented to:             Status of service:  In process, will continue to follow Medicare Important Message given?   (If response is "NO", the following Medicare IM given date fields will be blank) Date Medicare IM given:   Medicare IM given by:   Date Additional Medicare IM given:   Additional Medicare IM given by:    Discharge Disposition:    Per UR Regulation:  Reviewed for med. necessity/level of care/duration of stay  If discussed at [place] of Stay Meetings, dates discussed:    Comments:  11/02/14 Marney Doctor RN,BSN,NCM F/U appointment made for pt at the [facility]. Appointment placed on AVS and RN called to make aware and inform pt.  11/01/14 Marney Doctor RN,BSN,NCM [phone] Met with pt for PCP needs.  Pt states she does not have a PCP and does not have insurance.  Pt given info on [facility]-explained they can help her sign up for insurance and give her medication assistance when she leaves the hospital.  CM will continue to follow and make pt a follow up appointment on day of DC.

## 2014-11-02 NOTE — Progress Notes (Signed)
Nursing Discharge Summary  Patient ID: Kristin Patterson MRN: [id number] DOB/AGE: January 26, 1976 39 y.o.  Admit date: 10/30/2014 Discharge date: 11/02/2014  Discharged Condition: good  Disposition: 01-Home or Self Care  Follow-up Information    Follow up with [facility]    . Go on 11/04/2014.   Why:  be there at 8:45am.  Bring hospital discharge paperwork/Photo ID/$20 copay.   Contact information:   [address] [place] [id number] [phone]      Prescriptions Given: Prescription given for OxyIR for pain, the rest of the prescriptions called into the [facility] on [address]. Medications discussed and follow up appointments discussed. Patient verbalized understanding without further questions.   Means of Discharge: Patient to be transported via wheelchair downstairs to be discharged home once ride arrives.   Signed: Gloriajean DellBaldwin, Mechille Varghese Danielle 11/02/2014, 5:36 PM

## 2014-11-02 NOTE — Discharge Summary (Signed)
Physician Discharge Summary  VINITA PRENTISS ZOX:[id number] DOB: September 16, 1976 DOA: 10/30/2014  PCP: No PCP Per Patient  Admit date: 10/30/2014 Discharge date: 11/02/2014  Time spent: 30inutes  Recommendations for Outpatient Follow-up:  1. Complete Keflex by mouth 500 mg twice a day on 11/05/14 2. Patient will need an outpatient pain physician/rheumatologist to manage her fibromyalgia 3. Would consider age-appropriate screening for mammograms as she was having some costochondritis pain prior to discharge  4. Consider repeat LFTs as an outpatient as she had mild elevations probably related either pain medications or tylenol this admission   Discharge Diagnoses:  Principal Problem:   Acute pyelonephritis: Bilateral Active Problems:   Leukocytosis   Flu-like symptoms   UTI (lower urinary tract infection)   Hypokalemia   Tobacco dependence   Anemia   Discharge Condition: Fair  Diet recommendation: Regular  Filed Weights   10/31/14 0641 11/01/14 0555 11/02/14 0510  Weight: 65.318 kg (144 lb) 67.178 kg (148 lb 1.6 oz) 68.085 kg (150 lb 1.6 oz)    History of present illness:   39 year old Caucasian female admitted 10/30/14-patient not from [place] but moved recently from [place] with a diagnosis of chronic pain, 5 myalgia and found to have acute bilateral pyelonephritis. She was started on IV Rocephin and because of nausea and vomiting she was slow to respond. She was transitioned ultimately after taking a regular diet to Keflex by mouth twice a day as Escherichia coli was pansensitive. She does smoke and was offered a tobacco patch She was also offered a limited refill on her OxyIR for fibromyalgia pains although it was noted that she does not take any opiates at home and has not in a while he excellent I've encouraged her to follow-up with primary care physician as an outpatient She complained also of bilateral chest/rib pain-she had costochondral tenderness on the left side and right rib  tenderness under the right breast It was felt that these may be manifestations of her fibromyalgia and she was encouraged once again follow-up with primary care physician and get age-appropriate screening for mammogram if thought necessary.    Discharge Exam: Filed Vitals:   11/02/14 0510  BP: 129/57  Pulse: 88  Temp: 99 F (37.2 C)  Resp: 16    General: Alert pleasant oriented no apparent distress Cardiovascular: S1-S2 no murmur rub or gallop Respiratory: Clinically clear  Discharge Instructions   Discharge Instructions    Diet - low sodium heart healthy    Complete by:  As directed      Discharge instructions    Complete by:  As directed   Complete 3 more days of oral Keflex 500 mg twice a day for complicated urinary tract infection Continue gabapentin at a dose of 600 mg daily Consider outpatient workup for your chest pain and other pains which are consistent with fibromyalgia We have prescribed a limited amount of OxyIR for your pains. He will need a pain physician or primary physician to further fill these prescriptions     Increase activity slowly    Complete by:  As directed           Current Discharge Medication List    START taking these medications   Details  cephALEXin (KEFLEX) 500 MG capsule Take 1 capsule (500 mg total) by mouth every 12 (twelve) hours. Qty: 6 capsule, Refills: 0    gabapentin (NEURONTIN) 300 MG capsule Take 2 capsules (600 mg total) by mouth once. Qty: 60 capsule, Refills: 0    nicotine (NICODERM  CQ - DOSED IN MG/24 HOURS) 21 mg/24hr patch Place 1 patch (21 mg total) onto the skin daily. Qty: 28 patch, Refills: 0    oxyCODONE (OXY IR/ROXICODONE) 5 MG immediate release tablet Take 2 tablets (10 mg total) by mouth every 6 (six) hours as needed for severe pain. Qty: 20 tablet, Refills: 0      CONTINUE these medications which have NOT CHANGED   Details  pseudoephedrine-acetaminophen (TYLENOL SINUS) 30-500 MG TABS Take 1 tablet by mouth  every 4 (four) hours as needed (cold symptoms).    cyclobenzaprine (FLEXERIL) 10 MG tablet Take 1 tablet (10 mg total) by mouth 2 (two) times daily as needed for muscle spasms. Qty: 20 tablet, Refills: 0    promethazine (PHENERGAN) 25 MG tablet Take 1 tablet (25 mg total) by mouth every 6 (six) hours as needed for nausea or vomiting. Qty: 12 tablet, Refills: 0      STOP taking these medications     acetaminophen (TYLENOL) 500 MG tablet      ibuprofen (ADVIL,MOTRIN) 200 MG tablet      HYDROcodone-acetaminophen (NORCO/VICODIN) 5-325 MG per tablet        No Known Allergies Follow-up Information    Follow up with [facility]    . Schedule an appointment as soon as possible for a visit in 2 weeks.   Contact information:   [address] [place] [address] [phone]       The results of significant diagnostics from this hospitalization (including imaging, microbiology, ancillary and laboratory) are listed below for reference.    Significant Diagnostic Studies: Dg Chest 2 View  10/30/2014   CLINICAL DATA:  Leukocytosis. Pt c/o flu like symptoms since last Monday. Pt states that she has had fevers, nausea, productive cough, body aches that has flared up her fibromyalgia.  EXAM: CHEST  2 VIEW  COMPARISON:  None.  FINDINGS: Normal sized heart. Clear lungs. Mild diffuse peribronchial thickening. Unremarkable bones.  IMPRESSION: Mild bronchitic changes.   Electronically Signed   By: Gordan Payment M.D.   On: 10/30/2014 12:52   Ct Abdomen Pelvis W Contrast  10/30/2014   CLINICAL DATA:  Right-sided back pain and elevated white blood cell count. Flu-like symptoms since Monday.  EXAM: CT ABDOMEN AND PELVIS WITH CONTRAST  TECHNIQUE: Multidetector CT imaging of the abdomen and pelvis was performed using the standard protocol following bolus administration of intravenous contrast.  CONTRAST:  50mL OMNIPAQUE IOHEXOL 300 MG/ML SOLN, OMNIPAQUE  IOHEXOL 300 MG/ML SOLN  COMPARISON:  None.  FINDINGS: Lung bases are clear.  No evidence for free air.  Punctate hypodensity at the hepatic dome is nonspecific but likely represents a benign etiology such as a cyst. Otherwise, normal appearance of the liver, portal venous system and gallbladder. Normal appearance of the spleen, pancreas and adrenal glands.  There is mild stranding along the superior left kidney. The enhancement pattern in both kidneys is mildly heterogeneous. This is best seen along the right kidney lower pole on sequence 5, image 34. There is also mild heterogeneity along the left kidney lower pole, best seen on sequence 5, image 44. There is a small exophytic intermediate density lesion in the right kidney upper pole, that roughly measures 1 cm. This is not compatible with a simple cyst. No evidence for hydronephrosis.  Small amount of free fluid in the pelvis may be physiologic. No gross abnormality to the uterus or ovarian tissue. Urinary bladder is decompressed. Normal appearance  of the appendix. The small and large bowel are unremarkable. No significant abdominal or pelvic lymphadenopathy.  No acute bone abnormality.  IMPRESSION: Heterogeneous appearance of both kidneys with mild stranding around the left kidney. Findings are suggestive for acute pyelonephritis bilaterally. Negative for hydronephrosis.  There is an indeterminate 1 cm structure along the right kidney upper pole. This could represent a complex cyst or even a small abscess. Recommend follow up ultrasound after appropriate antimicrobial therapy. If this area persists, consider further evaluation with MRI for better characterization.   Electronically Signed   By: Richarda Overlie M.D.   On: 10/30/2014 11:06    Microbiology: Recent Results (from the past 240 hour(s))  Urine culture     Status: None   Collection Time: 10/30/14 11:05 AM  Result Value Ref Range Status   Specimen Description URINE, CLEAN CATCH  Final   Special  Requests NONE  Final   Colony Count   Final    >=100,000 COLONIES/ML Performed at [facility]    Culture   Final    ESCHERICHIA COLI Performed at [facility]    Report Status 11/01/2014 FINAL  Final   Organism ID, Bacteria ESCHERICHIA COLI  Final      Susceptibility   Escherichia coli - MIC*    AMPICILLIN <=2 SENSITIVE Sensitive     CEFAZOLIN <=4 SENSITIVE Sensitive     CEFTRIAXONE <=1 SENSITIVE Sensitive     CIPROFLOXACIN <=0.25 SENSITIVE Sensitive     GENTAMICIN <=1 SENSITIVE Sensitive     LEVOFLOXACIN 1 SENSITIVE Sensitive     NITROFURANTOIN <=16 SENSITIVE Sensitive     TOBRAMYCIN <=1 SENSITIVE Sensitive     TRIMETH/SULFA <=20 SENSITIVE Sensitive     PIP/TAZO <=4 SENSITIVE Sensitive     * ESCHERICHIA COLI  Culture, blood (routine x 2)     Status: None (Preliminary result)   Collection Time: 10/30/14 12:07 PM  Result Value Ref Range Status   Specimen Description BLOOD RIGHT ANTECUBITAL  Final   Special Requests BOTTLES DRAWN AEROBIC AND ANAEROBIC 5 CC EACH  Final   Culture   Final           BLOOD CULTURE RECEIVED NO GROWTH TO DATE CULTURE WILL BE HELD FOR 5 DAYS BEFORE ISSUING A FINAL NEGATIVE REPORT Performed at [facility]    Report Status PENDING  Incomplete  Culture, blood (routine x 2)     Status: None (Preliminary result)   Collection Time: 10/30/14 12:07 PM  Result Value Ref Range Status   Specimen Description BLOOD LEFT HAND  Final   Special Requests BOTTLES DRAWN AEROBIC AND ANAEROBIC 5 CC EACH  Final   Culture   Final           BLOOD CULTURE RECEIVED NO GROWTH TO DATE CULTURE WILL BE HELD FOR 5 DAYS BEFORE ISSUING A FINAL NEGATIVE REPORT Performed at [facility]    Report Status PENDING  Incomplete  Clostridium Difficile by PCR     Status: None   Collection Time: 10/31/14  5:15 AM  Result Value Ref Range Status   C difficile by pcr NEGATIVE NEGATIVE Final    Comment: Performed at [facility]     Labs: Basic  Metabolic Panel:  Recent Labs Lab 10/30/14 0912 10/30/14 1207 10/30/14 1219 10/31/14 0506 11/01/14 0450 11/02/14 0525  NA 139  --  138 141 140 141  K 3.6  --  3.0* 4.0 4.0 3.3*  CL 105  --  102 110  111 105  CO2 22  --   --  23 21 27   GLUCOSE 94  --  107* 82 113* 103*  BUN 16  --  15 13 8 7   CREATININE 0.75  --  0.70 0.80 0.82 0.74  CALCIUM 7.6*  --   --  7.3* 7.5* 7.9*  MG  --  1.8  --   --   --   --    Liver Function Tests:  Recent Labs Lab 10/30/14 0912 10/31/14 0506  AST 45* 25  ALT 40* 31  ALKPHOS 227* 196*  BILITOT 1.9* 0.9  PROT 6.8 6.5  ALBUMIN 2.9* 2.6*    Recent Labs Lab 10/30/14 0912 11/01/14 0450  LIPASE 16 14   No results for input(s): AMMONIA in the last 168 hours. CBC:  Recent Labs Lab 10/30/14 0912 10/30/14 1219 10/31/14 0506 11/01/14 0450 11/02/14 0525  WBC 27.9*  --  19.9* 11.8* 9.7  NEUTROABS 25.4*  --  16.8*  --   --   HGB 14.3 12.9 12.0 10.7* 12.2  HCT 40.8 38.0 36.8 32.5* 37.9  MCV 95.8  --  99.2 99.1 99.7  PLT 207  --  204 214 296   Cardiac Enzymes: No results for input(s): CKTOTAL, CKMB, CKMBINDEX, TROPONINI in the last 168 hours. BNP: BNP (last 3 results) No results for input(s): PROBNP in the last 8760 hours. CBG: No results for input(s): GLUCAP in the last 168 hours.     SignedRhetta Mura:  Aziya Arena, JAI-GURMUKH  [facility] 11/02/2014, 11:55 AM

## 2014-11-03 MED ORDER — CYCLOBENZAPRINE HCL 10 MG PO TABS: 10.0000 mg | ORAL_TABLET | Freq: Two times a day (BID) | ORAL | Status: DC | PRN

## 2014-11-05 LAB — CULTURE, BLOOD (ROUTINE X 2)
CULTURE: NO GROWTH
Culture: NO GROWTH

## 2014-11-11 ENCOUNTER — Emergency Department (HOSPITAL_COMMUNITY): Payer: Self-pay

## 2014-11-11 ENCOUNTER — Encounter (HOSPITAL_COMMUNITY): Payer: Self-pay | Admitting: *Deleted

## 2014-11-11 DIAGNOSIS — R42 Dizziness and giddiness: Secondary | ICD-10-CM | POA: Insufficient documentation

## 2014-11-11 DIAGNOSIS — R0981 Nasal congestion: Secondary | ICD-10-CM | POA: Insufficient documentation

## 2014-11-11 DIAGNOSIS — R0789 Other chest pain: Secondary | ICD-10-CM | POA: Insufficient documentation

## 2014-11-11 DIAGNOSIS — Z79899 Other long term (current) drug therapy: Secondary | ICD-10-CM | POA: Insufficient documentation

## 2014-11-11 DIAGNOSIS — Z720 Tobacco use: Secondary | ICD-10-CM | POA: Insufficient documentation

## 2014-11-11 DIAGNOSIS — Z792 Long term (current) use of antibiotics: Secondary | ICD-10-CM | POA: Insufficient documentation

## 2014-11-11 LAB — BASIC METABOLIC PANEL
Anion gap: 8 (ref 5–15)
BUN: 7 mg/dL (ref 6–23)
CALCIUM: 8.3 mg/dL — AB (ref 8.4–10.5)
CHLORIDE: 102 mmol/L (ref 96–112)
CO2: 25 mmol/L (ref 19–32)
CREATININE: 0.73 mg/dL (ref 0.50–1.10)
GFR calc Af Amer: >90 mL/min (ref >90)
Glucose, Bld: 96 mg/dL (ref 70–99)
Potassium: 3.7 mmol/L (ref 3.5–5.1)
Sodium: 135 mmol/L (ref 135–145)

## 2014-11-11 LAB — CBC
HCT: 38 % (ref 36.0–46.0)
Hemoglobin: 12.7 g/dL (ref 12.0–15.0)
MCH: 32.6 pg (ref 26.0–34.0)
MCHC: 33.4 g/dL (ref 30.0–36.0)
MCV: 97.4 fL (ref 78.0–100.0)
Platelets: 528 10*3/uL — ABNORMAL HIGH (ref 150–400)
RBC: 3.9 MIL/uL (ref 3.87–5.11)
RDW: 13.6 % (ref 11.5–15.5)
WBC: 8.6 10*3/uL (ref 4.0–10.5)

## 2014-11-11 LAB — I-STAT TROPONIN, ED: Troponin i, poc: 0 ng/mL (ref 0.00–0.08)

## 2014-11-11 NOTE — ED Notes (Signed)
Pt in c/o cough and congestion for the last week, today developed chest pain that is worse with breathing and coughing, no distress noted- cough is productive with green sputum

## 2014-11-12 ENCOUNTER — Encounter (HOSPITAL_COMMUNITY): Payer: Self-pay | Admitting: Radiology

## 2014-11-12 ENCOUNTER — Emergency Department (HOSPITAL_COMMUNITY)
Admission: EM | Admit: 2014-11-12 | Discharge: 2014-11-12 | Disposition: A | Discharge status: Home / Self Care | Payer: Self-pay | Attending: Emergency Medicine | Admitting: Emergency Medicine

## 2014-11-12 ENCOUNTER — Emergency Department (HOSPITAL_COMMUNITY): Payer: Self-pay

## 2014-11-12 DIAGNOSIS — R0789 Other chest pain: Secondary | ICD-10-CM

## 2014-11-12 LAB — HEPATIC FUNCTION PANEL
ALK PHOS: 140 U/L — AB (ref 39–117)
ALT: 20 U/L (ref 0–35)
AST: 25 U/L (ref 0–37)
Albumin: 3.5 g/dL (ref 3.5–5.2)
BILIRUBIN DIRECT: 0.1 mg/dL (ref 0.0–0.5)
BILIRUBIN TOTAL: 0.2 mg/dL — AB (ref 0.3–1.2)
Indirect Bilirubin: 0.1 mg/dL — ABNORMAL LOW (ref 0.3–0.9)
TOTAL PROTEIN: 7.3 g/dL (ref 6.0–8.3)

## 2014-11-12 LAB — D-DIMER, QUANTITATIVE (NOT AT ARMC): D DIMER QUANT: 2.16 ug{FEU}/mL — AB (ref 0.00–0.48)

## 2014-11-12 MED ORDER — IBUPROFEN 800 MG PO TABS
800.0000 mg | ORAL_TABLET | Freq: Once | ORAL | Status: AC
  Administered 2014-11-12: 800 mg via ORAL
  Filled 2014-11-12: qty 1

## 2014-11-12 MED ORDER — NAPROXEN 500 MG PO TABS: 500.0000 mg | ORAL_TABLET | Freq: Two times a day (BID) | ORAL | Status: DC

## 2014-11-12 MED ORDER — HYDROMORPHONE HCL 1 MG/ML IJ SOLN
1.0000 mg | Freq: Once | INTRAMUSCULAR | Status: AC
  Administered 2014-11-12: 1 mg via INTRAVENOUS
  Filled 2014-11-12: qty 1

## 2014-11-12 MED ORDER — HYDROMORPHONE HCL 2 MG PO TABS: 2.0000 mg | ORAL_TABLET | ORAL | Status: DC | PRN

## 2014-11-12 MED ORDER — METHYLPREDNISOLONE SODIUM SUCC 125 MG IJ SOLR
125.0000 mg | Freq: Once | INTRAMUSCULAR | Status: AC
  Administered 2014-11-12: 125 mg via INTRAVENOUS
  Filled 2014-11-12: qty 2

## 2014-11-12 MED ORDER — PREDNISONE 20 MG PO TABS: 60.0000 mg | ORAL_TABLET | Freq: Every day | ORAL | Status: DC

## 2014-11-12 MED ORDER — ONDANSETRON HCL 4 MG/2ML IJ SOLN
4.0000 mg | Freq: Once | INTRAMUSCULAR | Status: AC
  Administered 2014-11-12: 4 mg via INTRAVENOUS
  Filled 2014-11-12: qty 2

## 2014-11-12 MED ORDER — IOHEXOL 350 MG/ML SOLN
100.0000 mL | Freq: Once | INTRAVENOUS | Status: AC | PRN
  Administered 2014-11-12: 100 mL via INTRAVENOUS

## 2014-11-12 NOTE — Discharge Instructions (Signed)
Chest Wall Pain Chest wall pain is pain in or around the bones and muscles of your chest. It may take up to 6 weeks to get better. It may take longer if you must stay physically active in your work and activities.  CAUSES  Chest wall pain may happen on its own. However, it may be caused by:  A viral illness like the flu.  Injury.  Coughing.  Exercise.  Arthritis.  Fibromyalgia.  Shingles. HOME CARE INSTRUCTIONS   Avoid overtiring physical activity. Try not to strain or perform activities that cause pain. This includes any activities using your chest or your abdominal and side muscles, especially if heavy weights are used.  Put ice on the sore area.  Put ice in a plastic bag.  Place a towel between your skin and the bag.  Leave the ice on for 15-20 minutes per hour while awake for the first 2 days.  Only take over-the-counter or prescription medicines for pain, discomfort, or fever as directed by your caregiver. SEEK IMMEDIATE MEDICAL CARE IF:   Your pain increases, or you are very uncomfortable.  You have a fever.  Your chest pain becomes worse.  You have new, unexplained symptoms.  You have nausea or vomiting.  You feel sweaty or lightheaded.  You have a cough with phlegm (sputum), or you cough up blood. MAKE SURE YOU:   Understand these instructions.  Will watch your condition.  Will get help right away if you are not doing well or get worse. Document Released: 09/30/2005 Document Revised: 12/23/2011 Document Reviewed: 05/27/2011 [employer] Patient Information 2015 [place], [place]. This information is not intended to replace advice given to you by your health care provider. Make sure you discuss any questions you have with your health care provider.  Prednisone tablets What is this medicine? PREDNISONE (PRED ni sone) is a corticosteroid. It is commonly used to treat inflammation of the skin, joints, lungs, and other organs. Common conditions treated include  asthma, allergies, and arthritis. It is also used for other conditions, such as blood disorders and diseases of the adrenal glands. This medicine may be used for other purposes; ask your health care provider or pharmacist if you have questions. COMMON BRAND NAME(S): Deltasone, Predone, Sterapred, Sterapred DS What should I tell my health care provider before I take this medicine? They need to know if you have any of these conditions: -Cushing's syndrome -diabetes -glaucoma -heart disease -high blood pressure -infection (especially a virus infection such as chickenpox, cold sores, or herpes) -kidney disease -liver disease -mental illness -myasthenia gravis -osteoporosis -seizures -stomach or intestine problems -thyroid disease -an unusual or allergic reaction to lactose, prednisone, other medicines, foods, dyes, or preservatives -pregnant or trying to get pregnant -breast-feeding How should I use this medicine? Take this medicine by mouth with a glass of water. Follow the directions on the prescription label. Take this medicine with food. If you are taking this medicine once a day, take it in the morning. Do not take more medicine than you are told to take. Do not suddenly stop taking your medicine because you may develop a severe reaction. Your doctor will tell you how much medicine to take. If your doctor wants you to stop the medicine, the dose may be slowly lowered over time to avoid any side effects. Talk to your pediatrician regarding the use of this medicine in children. Special care may be needed. Overdosage: If you think you have taken too much of this medicine contact a poison control  center or emergency room at once. NOTE: This medicine is only for you. Do not share this medicine with others. What if I miss a dose? If you miss a dose, take it as soon as you can. If it is almost time for your next dose, talk to your doctor or health care professional. You may need to miss a dose  or take an extra dose. Do not take double or extra doses without advice. What may interact with this medicine? Do not take this medicine with any of the following medications: -metyrapone -mifepristone This medicine may also interact with the following medications: -aminoglutethimide -amphotericin B -aspirin and aspirin-like medicines -barbiturates -certain medicines for diabetes, like glipizide or glyburide -cholestyramine -cholinesterase inhibitors -cyclosporine -digoxin -diuretics -ephedrine -female hormones, like estrogens and birth control pills -isoniazid -ketoconazole -NSAIDS, medicines for pain and inflammation, like ibuprofen or naproxen -phenytoin -rifampin -toxoids -vaccines -warfarin This list may not describe all possible interactions. Give your health care provider a list of all the medicines, herbs, non-prescription drugs, or dietary supplements you use. Also tell them if you smoke, drink alcohol, or use illegal drugs. Some items may interact with your medicine. What should I watch for while using this medicine? Visit your doctor or health care professional for regular checks on your progress. If you are taking this medicine over a prolonged period, carry an identification card with your name and address, the type and dose of your medicine, and your doctor's name and address. This medicine may increase your risk of getting an infection. Tell your doctor or health care professional if you are around anyone with measles or chickenpox, or if you develop sores or blisters that do not heal properly. If you are going to have surgery, tell your doctor or health care professional that you have taken this medicine within the last twelve months. Ask your doctor or health care professional about your diet. You may need to lower the amount of salt you eat. This medicine may affect blood sugar levels. If you have diabetes, check with your doctor or health care professional before  you change your diet or the dose of your diabetic medicine. What side effects may I notice from receiving this medicine? Side effects that you should report to your doctor or health care professional as soon as possible: -allergic reactions like skin rash, itching or hives, swelling of the face, lips, or tongue -changes in emotions or moods -changes in vision -depressed mood -eye pain -fever or chills, cough, sore throat, pain or difficulty passing urine -increased thirst -swelling of ankles, feet Side effects that usually do not require medical attention (report to your doctor or health care professional if they continue or are bothersome): -confusion, excitement, restlessness -headache -nausea, vomiting -skin problems, acne, thin and shiny skin -trouble sleeping -weight gain This list may not describe all possible side effects. Call your doctor for medical advice about side effects. You may report side effects to FDA at 1-800-FDA-1088. Where should I keep my medicine? Keep out of the reach of children. Store at room temperature between 15 and 30 degrees C (59 and 86 degrees F). Protect from light. Keep container tightly closed. Throw away any unused medicine after the expiration date. NOTE: This sheet is a summary. It may not cover all possible information. If you have questions about this medicine, talk to your doctor, pharmacist, or health care provider.  2015, [employer]. (2011-05-16 10:57:14)  Naproxen and naproxen sodium oral immediate-release tablets What is this medicine? NAPROXEN (na  PROX en) is a non-steroidal anti-inflammatory drug (NSAID). It is used to reduce swelling and to treat pain. This medicine may be used for dental pain, headache, or painful monthly periods. It is also used for painful joint and muscular problems such as arthritis, tendinitis, bursitis, and gout. This medicine may be used for other purposes; ask your health care provider or pharmacist if  you have questions. COMMON BRAND NAME(S): Aflaxen, Aleve, Aleve Arthritis, All Day Relief, Anaprox, Anaprox DS, Naprosyn What should I tell my health care provider before I take this medicine? They need to know if you have any of these conditions: -asthma -cigarette smoker -drink more than 3 alcohol containing drinks a day -heart disease or circulation problems such as heart failure or leg edema (fluid retention) -high blood pressure -kidney disease -liver disease -stomach bleeding or ulcers -an unusual or allergic reaction to naproxen, aspirin, other NSAIDs, other medicines, foods, dyes, or preservatives -pregnant or trying to get pregnant -breast-feeding How should I use this medicine? Take this medicine by mouth with a glass of water. Follow the directions on the prescription label. Take it with food if your stomach gets upset. Try to not lie down for at least 10 minutes after you take it. Take your medicine at regular intervals. Do not take your medicine more often than directed. Long-term, continuous use may increase the risk of heart attack or stroke. A special MedGuide will be given to you by the pharmacist with each prescription and refill. Be sure to read this information carefully each time. Talk to your pediatrician regarding the use of this medicine in children. Special care may be needed. Overdosage: If you think you have taken too much of this medicine contact a poison control center or emergency room at once. NOTE: This medicine is only for you. Do not share this medicine with others. What if I miss a dose? If you miss a dose, take it as soon as you can. If it is almost time for your next dose, take only that dose. Do not take double or extra doses. What may interact with this medicine? -alcohol -aspirin -cidofovir -diuretics -lithium -methotrexate -other drugs for inflammation like ketorolac or prednisone -pemetrexed -probenecid -warfarin This list may not describe  all possible interactions. Give your health care provider a list of all the medicines, herbs, non-prescription drugs, or dietary supplements you use. Also tell them if you smoke, drink alcohol, or use illegal drugs. Some items may interact with your medicine. What should I watch for while using this medicine? Tell your doctor or health care professional if your pain does not get better. Talk to your doctor before taking another medicine for pain. Do not treat yourself. This medicine does not prevent heart attack or stroke. In fact, this medicine may increase the chance of a heart attack or stroke. The chance may increase with longer use of this medicine and in people who have heart disease. If you take aspirin to prevent heart attack or stroke, talk with your doctor or health care professional. Do not take other medicines that contain aspirin, ibuprofen, or naproxen with this medicine. Side effects such as stomach upset, nausea, or ulcers may be more likely to occur. Many medicines available without a prescription should not be taken with this medicine. This medicine can cause ulcers and bleeding in the stomach and intestines at any time during treatment. Do not smoke cigarettes or drink alcohol. These increase irritation to your stomach and can make it more susceptible to damage  from this medicine. Ulcers and bleeding can happen without warning symptoms and can cause death. You may get drowsy or dizzy. Do not drive, use machinery, or do anything that needs mental alertness until you know how this medicine affects you. Do not stand or sit up quickly, especially if you are an older patient. This reduces the risk of dizzy or fainting spells. This medicine can cause you to bleed more easily. Try to avoid damage to your teeth and gums when you brush or floss your teeth. What side effects may I notice from receiving this medicine? Side effects that you should report to your doctor or health care professional as  soon as possible: -black or bloody stools, blood in the urine or vomit -blurred vision -chest pain -difficulty breathing or wheezing -nausea or vomiting -severe stomach pain -skin rash, skin redness, blistering or peeling skin, hives, or itching -slurred speech or weakness on one side of the body -swelling of eyelids, throat, lips -unexplained weight gain or swelling -unusually weak or tired -yellowing of eyes or skin Side effects that usually do not require medical attention (report to your doctor or health care professional if they continue or are bothersome): -constipation -headache -heartburn This list may not describe all possible side effects. Call your doctor for medical advice about side effects. You may report side effects to FDA at 1-800-FDA-1088. Where should I keep my medicine? Keep out of the reach of children. Store at room temperature between 15 and 30 degrees C (59 and 86 degrees F). Keep container tightly closed. Throw away any unused medicine after the expiration date. NOTE: This sheet is a summary. It may not cover all possible information. If you have questions about this medicine, talk to your doctor, pharmacist, or health care provider.  2015, [employer]. (2009-10-02 20:10:16)  Hydromorphone tablets What is this medicine? HYDROMORPHONE (hye droe MOR fone) is a pain reliever. It is used to treat moderate to severe pain. This medicine may be used for other purposes; ask your health care provider or pharmacist if you have questions. COMMON BRAND NAME(S): Dilaudid What should I tell my health care provider before I take this medicine? They need to know if you have any of these conditions: -brain tumor -drug abuse or addiction -head injury -heart disease -frequently drink alcohol containing drinks -kidney disease or problems going to the bathroom -liver disease -lung disease, asthma, or breathing problems -mental problems -an allergic or unusual  reaction to lactose, hydromorphone, other opioid analgesics, other medicines, sulfites, foods, dyes, or preservatives -pregnant or trying to get pregnant -breast-feeding How should I use this medicine? Take this medicine by mouth with a glass of water. If the medicine upsets your stomach, take it with food or milk. Follow the directions on the prescription label. Do not take more medicine than you are told to take. Talk to your pediatrician regarding the use of this medicine in children. Special care may be needed. Overdosage: If you think you have taken too much of this medicine contact a poison control center or emergency room at once. NOTE: This medicine is only for you. Do not share this medicine with others. What if I miss a dose? If you miss a dose, take it as soon as you can. If it is almost time for your next dose, take only that dose. Do not take double or extra doses. What may interact with this medicine? -alcohol -antihistamines for allergy, cough and cold -medicines for anesthesia -medicines for depression, anxiety, or psychotic  disturbances -medicines for sleep -muscle relaxants -naltrexone -narcotic medicines (opiates) for pain -phenothiazines like chlorpromazine, mesoridazine, prochlorperazine, thioridazine -tramadol This list may not describe all possible interactions. Give your health care provider a list of all the medicines, herbs, non-prescription drugs, or dietary supplements you use. Also tell them if you smoke, drink alcohol, or use illegal drugs. Some items may interact with your medicine. What should I watch for while using this medicine? Tell your doctor or health care professional if your pain does not go away, if it gets worse, or if you have new or a different type of pain. You may develop tolerance to the medicine. Tolerance means that you will need a higher dose of the medicine for pain relief. Tolerance is normal and is expected if you take this medicine for a  long time. Do not suddenly stop taking your medicine because you may develop a severe reaction. Your body becomes used to the medicine. This does NOT mean you are addicted. Addiction is a behavior related to getting and using a drug for a non-medical reason. If you have pain, you have a medical reason to take pain medicine. Your doctor will tell you how much medicine to take. If your doctor wants you to stop the medicine, the dose will be slowly lowered over time to avoid any side effects. You may get drowsy or dizzy. Do not drive, use machinery, or do anything that needs mental alertness until you know how this medicine affects you. Do not stand or sit up quickly, especially if you are an older patient. This reduces the risk of dizzy or fainting spells. Alcohol may interfere with the effect of this medicine. Avoid alcoholic drinks. There are different types of narcotic medicines (opiates) for pain. If you take more than one type at the same time, you may have more side effects. Give your health care provider a list of all medicines you use. Your doctor will tell you how much medicine to take. Do not take more medicine than directed. Call emergency for help if you have problems breathing. This medicine will cause constipation. Try to have a bowel movement at least every 2 to 3 days. If you do not have a bowel movement for 3 days, call your doctor or health care professional. Your mouth may get dry. Chewing sugarless gum or sucking hard candy, and drinking plenty of water may help. Contact your doctor if the problem does not go away or is severe. What side effects may I notice from receiving this medicine? Side effects that you should report to your doctor or health care professional as soon as possible: -allergic reactions like skin rash, itching or hives, swelling of the face, lips, or tongue -breathing problems -changes in vision -confusion -feeling faint or lightheaded, falls -seizures -slow or fast  heartbeat -trouble passing urine or change in the amount of urine -trouble with balance, talking, walking -unusually weak or tired Side effects that usually do not require medical attention (report to your doctor or health care professional if they continue or are bothersome): -difficulty sleeping -drowsiness -dry mouth -flushing -headache -itching -loss of appetite -nausea, vomiting This list may not describe all possible side effects. Call your doctor for medical advice about side effects. You may report side effects to FDA at 1-800-FDA-1088. Where should I keep my medicine? Keep out of the reach of children. This medicine can be abused. Keep your medicine in a safe place to protect it from theft. Do not share this medicine with  anyone. Selling or giving away this medicine is dangerous and against the law. Store at room temperature between 15 and 30 degrees C (59 and 86 degrees F). Keep container tightly closed. Protect from light. This medicine may cause accidental overdose and death if it is taken by other adults, children, or pets. Flush any unused medicine down the toilet to reduce the chance of harm. Do not use the medicine after the expiration date. NOTE: This sheet is a summary. It may not cover all possible information. If you have questions about this medicine, talk to your doctor, pharmacist, or health care provider.  2015, [employer]. (2013-05-04 09:50:15)

## 2014-11-12 NOTE — ED Provider Notes (Signed)
CSN: [id number]     Arrival date & time 11/11/14  2226 History  This chart was scribed for Dione Booze, MD by Bronson Curb, ED Scribe. This patient was seen in room A07C/A07C and the patient's care was started at 12:28 AM.   Chief Complaint  Patient presents with  . Chest Pain  . Cough    The history is provided by the patient. No language interpreter was used.     HPI Comments: Kristin Patterson is a 39 y.o. female who presents to the Emergency Department complaining of constant, sharp, 8/10, left anterior chest pain with onset approximately 2 weeks ago days ago. Patient was seen here 12 days ago for a kidney infection and reports the chest pain began 2 days after being admitted. She states she received 1 injection to her abdomen while hospitalized. There is associated cough productive of green sputum, congestion, nausea, lightheadedness, left arm pain that is worse when lifting. She reports the pain is worse with deep breathing and cough. Patient has taken Tylenol without significant improvement. She denies recent long distance travel. Patient reports past surgical history of tubal ligation (14 years ago). She denies fever, chills, SOB, diaphoresis.    Past Medical History  Diagnosis Date  . Fibromyalgia   . Endometriosis   . Interstitial cystitis   . Irritable bowel syndrome (IBS)   . Tobacco dependence 10/30/2014   Past Surgical History  Procedure Laterality Date  . Endometreosis    . Tubal ligation     History reviewed. No pertinent family history. History  Substance Use Topics  . Smoking status: Current Every Day Smoker -- 1.50 packs/day    Types: Cigarettes  . Smokeless tobacco: Not on file  . Alcohol Use: Yes   OB History    No data available     Review of Systems  Constitutional: Negative for fever, chills and diaphoresis.  HENT: Positive for congestion.   Respiratory: Positive for cough.   Cardiovascular: Positive for chest pain.  Neurological: Positive for  light-headedness.  All other systems reviewed and are negative.     Allergies  Review of patient's allergies indicates no known allergies.  Home Medications   Prior to Admission medications   Medication Sig Start Date End Date Taking? Authorizing Provider  cephALEXin (KEFLEX) 500 MG capsule Take 1 capsule (500 mg total) by mouth every 12 (twelve) hours. 11/02/14   Rhetta Mura, MD  cyclobenzaprine (FLEXERIL) 10 MG tablet Take 1 tablet (10 mg total) by mouth 2 (two) times daily as needed for muscle spasms. 11/03/14   Rhetta Mura, MD  gabapentin (NEURONTIN) 600 MG tablet Take 1 tablet (600 mg total) by mouth daily. 11/02/14   Rhetta Mura, MD  nicotine (NICODERM CQ - DOSED IN MG/24 HOURS) 21 mg/24hr patch Place 1 patch (21 mg total) onto the skin daily. 11/02/14   Rhetta Mura, MD  oxyCODONE (OXY IR/ROXICODONE) 5 MG immediate release tablet Take 2 tablets (10 mg total) by mouth every 6 (six) hours as needed for severe pain. 11/02/14   Rhetta Mura, MD  promethazine (PHENERGAN) 25 MG tablet Take 1 tablet (25 mg total) by mouth every 6 (six) hours as needed for nausea or vomiting. 07/29/14   Kaitlyn Szekalski, PA-C  pseudoephedrine-acetaminophen (TYLENOL SINUS) 30-500 MG TABS Take 1 tablet by mouth every 4 (four) hours as needed (cold symptoms).    Historical Provider, MD   Triage Vitals: BP 137/100 mmHg  Pulse 105  Temp(Src) 98.5 F (36.9 C) (Oral)  Resp 20  Ht 5\' 6"  (1.676 m)  Wt 150 lb (68.04 kg)  BMI 24.22 kg/m2  SpO2 100%  LMP 10/26/2014  Physical Exam  Constitutional: She is oriented to person, place, and time. She appears well-developed and well-nourished. No distress.  HENT:  Head: Normocephalic and atraumatic.  Eyes: Conjunctivae and EOM are normal. Pupils are equal, round, and reactive to light.  Neck: Normal range of motion. Neck supple. No JVD present.  Cardiovascular: Normal rate, regular rhythm, normal heart sounds and intact distal  pulses.   No murmur heard. Pulmonary/Chest: Effort normal and breath sounds normal. She has no wheezes. She has no rales. She exhibits tenderness.  Left parasternal chest tenderness.  Abdominal: Soft. Bowel sounds are normal. She exhibits no distension and no mass. There is no tenderness.  Musculoskeletal: Normal range of motion. She exhibits no edema.  Lymphadenopathy:    She has no cervical adenopathy.  Neurological: She is alert and oriented to person, place, and time. No cranial nerve deficit. Coordination normal.  Skin: Skin is warm and dry. No rash noted.  Psychiatric: She has a normal mood and affect. Her behavior is normal. Thought content normal.  Nursing note and vitals reviewed.   ED Course  Procedures (including critical care time)  DIAGNOSTIC STUDIES: Oxygen Saturation is 100% on room air, normal by my interpretation.    COORDINATION OF CARE: At 0035 Discussed treatment plan with patient which includes ibuprofen and labs. Patient agrees.   Labs Review Labs Reviewed  CBC - Abnormal; Notable for the following:    Platelets 528 (*)    All other components within normal limits  BASIC METABOLIC PANEL - Abnormal; Notable for the following:    Calcium 8.3 (*)    All other components within normal limits  I-STAT TROPOININ, ED    Imaging Review Dg Chest 2 View  11/12/2014   CLINICAL DATA:  Patient state she has been having chest pain X 1 week. Patient states her chest pains are located on the left side and radiate to her left arm. Patient also states she was in the hospital X 1 week ago with an infection in both of her kidney's. Patient also states that her chest pain started while she was in the hospital with the infection. Patient has had no surgeries on the heart or lungs. Patient is a smoker.  EXAM: CHEST  2 VIEW  COMPARISON:  10/30/2014  FINDINGS: There is left basilar atelectasis. There is no focal parenchymal opacity, pleural effusion, or pneumothorax. The heart and  mediastinal contours are unremarkable.  The osseous structures are unremarkable.  IMPRESSION: No active cardiopulmonary disease.   Electronically Signed   By: Elige KoHetal  Patel   On: 11/12/2014 00:04     EKG Interpretation   Date/Time:  Friday November 11 2014 22:38:15 EST Ventricular Rate:  102 PR Interval:  146 QRS Duration: 90 QT Interval:  394 QTC Calculation: 513 R Axis:   81 Text Interpretation:  Sinus tachycardia Otherwise normal ECG No old  tracing to compare Confirmed by [facility]  MD, Generoso Cropper ([id number]) on 11/12/2014  12:27:05 AM      MDM   Final diagnoses:  Chest wall pain    Chest pain most consistent with chest wall pain. She definitely has chest wall tenderness. However, she did have a recent hospitalization. She states he only received one injection of enoxaparin while in the hospital, so she would be at risk for DVT and pulmonary embolism. She was given a dose of ibuprofen without significant relief.  Screening labs are obtained and d-dimer has come back elevated. Review of hospital records from shows that she did have elevated liver enzymes which are supposed to be rechecked for these are rechecked today and had continued to show some improvement. There is some mild elevation of alkaline phosphatase which is declining. She is sent for CT angiogram of the chest which is negative for pulmonary embolism or pneumonia. She was given hydromorphone for pain. She is already taking Opana at home and is likely to need high doses of narcotics for pain control. At this point, I suspect that she has inflammatory or costochondritis. She is given a dose of methylprednisolone and she is discharged with prescriptions for prednisone and naproxen as well as hydromorphone for pain.  I personally performed the services described in this documentation, which was scribed in my presence. The recorded information has been reviewed and is accurate.     Dione Booze, MD 11/12/14 [id number]

## 2014-11-12 NOTE — ED Notes (Signed)
Patient transported to CT 

## 2015-04-19 ENCOUNTER — Emergency Department (HOSPITAL_COMMUNITY)
Admission: EM | Admit: 2015-04-19 | Discharge: 2015-04-20 | Disposition: A | Discharge status: Home / Self Care | Payer: Self-pay | Attending: Emergency Medicine | Admitting: Emergency Medicine

## 2015-04-19 ENCOUNTER — Encounter (HOSPITAL_COMMUNITY): Payer: Self-pay | Admitting: *Deleted

## 2015-04-19 ENCOUNTER — Emergency Department (HOSPITAL_COMMUNITY): Payer: Self-pay

## 2015-04-19 DIAGNOSIS — Z720 Tobacco use: Secondary | ICD-10-CM | POA: Insufficient documentation

## 2015-04-19 DIAGNOSIS — Z87448 Personal history of other diseases of urinary system: Secondary | ICD-10-CM | POA: Insufficient documentation

## 2015-04-19 DIAGNOSIS — E860 Dehydration: Secondary | ICD-10-CM | POA: Insufficient documentation

## 2015-04-19 DIAGNOSIS — Z8719 Personal history of other diseases of the digestive system: Secondary | ICD-10-CM | POA: Insufficient documentation

## 2015-04-19 DIAGNOSIS — M797 Fibromyalgia: Secondary | ICD-10-CM | POA: Insufficient documentation

## 2015-04-19 DIAGNOSIS — G8929 Other chronic pain: Secondary | ICD-10-CM | POA: Insufficient documentation

## 2015-04-19 DIAGNOSIS — R109 Unspecified abdominal pain: Secondary | ICD-10-CM | POA: Insufficient documentation

## 2015-04-19 DIAGNOSIS — Z9851 Tubal ligation status: Secondary | ICD-10-CM | POA: Insufficient documentation

## 2015-04-19 LAB — CBC WITH DIFFERENTIAL/PLATELET
Basophils Absolute: 0.1 10*3/uL (ref 0.0–0.1)
Basophils Relative: 1 % (ref 0–1)
Eosinophils Absolute: 0.1 10*3/uL (ref 0.0–0.7)
Eosinophils Relative: 1 % (ref 0–5)
HCT: 42.4 % (ref 36.0–46.0)
Hemoglobin: 15.4 g/dL — ABNORMAL HIGH (ref 12.0–15.0)
Lymphocytes Relative: 29 % (ref 12–46)
Lymphs Abs: 2.8 10*3/uL (ref 0.7–4.0)
MCH: 32.5 pg (ref 26.0–34.0)
MCHC: 36.3 g/dL — ABNORMAL HIGH (ref 30.0–36.0)
MCV: 89.5 fL (ref 78.0–100.0)
MONO ABS: 0.6 10*3/uL (ref 0.1–1.0)
MONOS PCT: 6 % (ref 3–12)
NEUTROS ABS: 6.3 10*3/uL (ref 1.7–7.7)
Neutrophils Relative %: 63 % (ref 43–77)
Platelets: 225 10*3/uL (ref 150–400)
RBC: 4.74 MIL/uL (ref 3.87–5.11)
RDW: 12.5 % (ref 11.5–15.5)
WBC: 9.8 10*3/uL (ref 4.0–10.5)

## 2015-04-19 LAB — URINALYSIS, ROUTINE W REFLEX MICROSCOPIC
GLUCOSE, UA: NEGATIVE mg/dL
Ketones, ur: >80 mg/dL — AB
Leukocytes, UA: NEGATIVE
Nitrite: NEGATIVE
PH: 5 (ref 5.0–8.0)
Protein, ur: 30 mg/dL — AB
SPECIFIC GRAVITY, URINE: 1.031 — AB (ref 1.005–1.030)
Urobilinogen, UA: 1 mg/dL (ref 0.0–1.0)

## 2015-04-19 LAB — URINE MICROSCOPIC-ADD ON

## 2015-04-19 LAB — COMPREHENSIVE METABOLIC PANEL
ALT: 14 U/L (ref 14–54)
ANION GAP: 13 (ref 5–15)
AST: 20 U/L (ref 15–41)
Albumin: 4.3 g/dL (ref 3.5–5.0)
Alkaline Phosphatase: 62 U/L (ref 38–126)
BUN: 9 mg/dL (ref 6–20)
CO2: 20 mmol/L — ABNORMAL LOW (ref 22–32)
Calcium: 8.8 mg/dL — ABNORMAL LOW (ref 8.9–10.3)
Chloride: 103 mmol/L (ref 101–111)
Creatinine, Ser: 0.87 mg/dL (ref 0.44–1.00)
GFR calc non Af Amer: >60 mL/min (ref >60)
GLUCOSE: 86 mg/dL (ref 65–99)
Potassium: 3.5 mmol/L (ref 3.5–5.1)
SODIUM: 136 mmol/L (ref 135–145)
TOTAL PROTEIN: 7.2 g/dL (ref 6.5–8.1)
Total Bilirubin: 1.2 mg/dL (ref 0.3–1.2)

## 2015-04-19 LAB — LIPASE, BLOOD: Lipase: 14 U/L — ABNORMAL LOW (ref 22–51)

## 2015-04-19 MED ORDER — KETOROLAC TROMETHAMINE 30 MG/ML IJ SOLN
30.0000 mg | Freq: Once | INTRAMUSCULAR | Status: AC
  Administered 2015-04-20: 30 mg via INTRAVENOUS
  Filled 2015-04-19: qty 1

## 2015-04-19 MED ORDER — SODIUM CHLORIDE 0.9 % IV BOLUS (SEPSIS)
1000.0000 mL | Freq: Once | INTRAVENOUS | Status: AC
  Administered 2015-04-19: 1000 mL via INTRAVENOUS

## 2015-04-19 MED ORDER — MORPHINE SULFATE 4 MG/ML IJ SOLN
4.0000 mg | Freq: Once | INTRAMUSCULAR | Status: AC
  Administered 2015-04-19: 4 mg via INTRAVENOUS
  Filled 2015-04-19: qty 1

## 2015-04-19 NOTE — Care Management (Addendum)
ED CM met with patient at bedside regarding resources for homelessness and  f/u care. Patient made aware of the resources available at the [facility] day [facility]. Patient amendable to the resources. In formation placed on AVS for the [facility]. No further ED CM needs identified.  

## 2015-04-19 NOTE — ED Provider Notes (Signed)
CSN: [id number]     Arrival date & time 04/19/15  1830 History   First MD Initiated Contact with Patient 04/19/15 2123     Chief Complaint  Patient presents with  . Flank Pain     (Consider location/radiation/quality/duration/timing/severity/associated sxs/prior Treatment) Patient is a 39 y.o. female presenting with flank pain. The history is provided by the patient.  Flank Pain This is a chronic problem.   patient with right-sided flank pain. States that she has had it for months. She was admitted for bilateral pyelonephritis 6 months ago. States she's had pain ever since. States she's given 5 days of anabolic's. She states she did not follow-up because she does not have a doctor. States the pain is continued. States it got bad enough now that she had to come back in. Slight urinary frequency but no burning. No fevers. She has had some nausea. The pain is dull and constant. Worse with movement. There is some radiation of the back down also. No vaginal bleeding or discharge. She states she's had her tubes tied. No headache.  Past Medical History  Diagnosis Date  . Fibromyalgia   . Endometriosis   . Interstitial cystitis   . Irritable bowel syndrome (IBS)   . Tobacco dependence 10/30/2014   Past Surgical History  Procedure Laterality Date  . Endometreosis    . Tubal ligation     No family history on file. History  Substance Use Topics  . Smoking status: Current Every Day Smoker -- 1.50 packs/day    Types: Cigarettes  . Smokeless tobacco: Not on file  . Alcohol Use: Yes   OB History    No data available     Review of Systems  Genitourinary: Positive for flank pain.      Allergies  Review of patient's allergies indicates no known allergies.  Home Medications   Prior to Admission medications   Medication Sig Start Date End Date Taking? Authorizing Provider  aspirin-acetaminophen-caffeine (EXCEDRIN MIGRAINE) [phone] MG per tablet Take 1 tablet by mouth every 6 (six)  hours as needed for headache.   Yes Historical Provider, MD  Aspirin-Salicylamide-Caffeine (BC HEADACHE PO) Take 1 packet by mouth daily as needed.   Yes Historical Provider, MD  Ibuprofen (MIDOL) 200 MG CAPS Take 1 capsule by mouth daily as needed. For cramps per patient   Yes Historical Provider, MD  cephALEXin (KEFLEX) 500 MG capsule Take 1 capsule (500 mg total) by mouth every 12 (twelve) hours. Patient not taking: Reported on 11/12/2014 11/02/14   Rhetta MuraJai-Gurmukh Samtani, MD  cyclobenzaprine (FLEXERIL) 10 MG tablet Take 1 tablet (10 mg total) by mouth 2 (two) times daily as needed for muscle spasms. Patient not taking: Reported on 04/19/2015 11/03/14   Rhetta MuraJai-Gurmukh Samtani, MD  gabapentin (NEURONTIN) 600 MG tablet Take 1 tablet (600 mg total) by mouth daily. Patient not taking: Reported on 04/19/2015 11/02/14   Rhetta MuraJai-Gurmukh Samtani, MD  HYDROmorphone (DILAUDID) 2 MG tablet Take 1-2 tablets (2-4 mg total) by mouth every 4 (four) hours as needed for severe pain. Patient not taking: Reported on 04/19/2015 11/12/14   Dione Boozeavid Glick, MD  naproxen (NAPROSYN) 500 MG tablet Take 1 tablet (500 mg total) by mouth 2 (two) times daily. Patient not taking: Reported on 04/19/2015 11/12/14   Dione Boozeavid Glick, MD  nicotine (NICODERM CQ - DOSED IN MG/24 HOURS) 21 mg/24hr patch Place 1 patch (21 mg total) onto the skin daily. Patient not taking: Reported on 11/12/2014 11/02/14   Rhetta MuraJai-Gurmukh Samtani, MD  oxyCODONE (OXY IR/ROXICODONE)  5 MG immediate release tablet Take 2 tablets (10 mg total) by mouth every 6 (six) hours as needed for severe pain. Patient not taking: Reported on 04/19/2015 11/02/14   Rhetta Mura, MD  predniSONE (DELTASONE) 20 MG tablet Take 3 tablets (60 mg total) by mouth daily. Patient not taking: Reported on 04/19/2015 11/12/14   Dione Booze, MD  promethazine (PHENERGAN) 25 MG tablet Take 1 tablet (25 mg total) by mouth every 6 (six) hours as needed for nausea or vomiting. 04/20/15   Benjiman Core, MD   BP 110/68  mmHg  Pulse 74  Temp(Src) 98.8 F (37.1 C) (Oral)  Resp 16  Ht  (1.676 m)  Wt 125 lb (56.7 kg)  BMI 20.19 kg/m2  SpO2 98%  LMP 03/26/2015 Physical Exam  Constitutional: She appears well-developed.  HENT:  Head: Atraumatic.  Eyes: EOM are normal.  Neck: Neck supple.  Cardiovascular: Normal rate and regular rhythm.   Pulmonary/Chest: Effort normal.  Abdominal: Soft. There is tenderness.  Right flank/CVA tenderness. No masses. No hernias.  Musculoskeletal: Normal range of motion.  Neurological: She is alert.  Skin: Skin is warm.    ED Course  Procedures (including critical care time) Labs Review Labs Reviewed  URINALYSIS, ROUTINE W REFLEX MICROSCOPIC (NOT AT [facility]) - Abnormal; Notable for the following:    Color, Urine AMBER (*)    APPearance CLOUDY (*)    Specific Gravity, Urine 1.031 (*)    Hgb urine dipstick SMALL (*)    Bilirubin Urine MODERATE (*)    Ketones, ur >80 (*)    Protein, ur 30 (*)    All other components within normal limits  URINE MICROSCOPIC-ADD ON - Abnormal; Notable for the following:    Squamous Epithelial / LPF MANY (*)    Bacteria, UA FEW (*)    Casts HYALINE CASTS (*)    All other components within normal limits  COMPREHENSIVE METABOLIC PANEL - Abnormal; Notable for the following:    CO2 20 (*)    Calcium 8.8 (*)    All other components within normal limits  LIPASE, BLOOD - Abnormal; Notable for the following:    Lipase 14 (*)    All other components within normal limits  CBC WITH DIFFERENTIAL/PLATELET - Abnormal; Notable for the following:    Hemoglobin 15.4 (*)    MCHC 36.3 (*)    All other components within normal limits  URINE CULTURE  URINE RAPID DRUG SCREEN, HOSP PERFORMED    Imaging Review US Renal  04/19/2015   CLINICAL DATA:  Chronic bilateral flank pain for 6 months. Initial encounter.  EXAM: RENAL / URINARY TRACT ULTRASOUND COMPLETE  COMPARISON:  CT of the abdomen and pelvis from 10/30/2014  FINDINGS: Right Kidney:   Length: 10.8 cm. Echogenicity within normal limits. No mass or hydronephrosis visualized.  Left Kidney:  Length: 11.3 cm. Echogenicity within normal limits. No mass or hydronephrosis visualized.  Bladder:  Appears normal for degree of bladder distention. Bilateral ureteral jets are visualized.  IMPRESSION: Unremarkable renal ultrasound.  No evidence for hydronephrosis.   Electronically Signed   By: Roanna Raider M.D.   On: 04/19/2015 23:33     EKG Interpretation None      MDM   Final diagnoses:  Chronic flank pain  Dehydration    Patient with abdominal pain/flank pain over the last 6 months. Left buttock shows some dehydration but otherwise reassuring. Ultrasound done due to previous Jai low and CT scan showed possible abscess. No abscess seen now.  Only few bacteria in the urine. Denies possibility of pregnancy feels somewhat better after treatment. Discharge home and has been given follow-up with the interactive resource center.    Benjiman Core, MD 04/20/15 [facility] Manners

## 2015-04-19 NOTE — ED Notes (Signed)
Pt c/o bilat flank pain x 6 months. States she was seen here 6 months ago and the pain hasn't subsided. States she finished her dose of abx for the kidney infection. States she did not follow up as instructed because she doesn't have insurance.

## 2015-04-19 NOTE — ED Notes (Signed)
Pt reports homeless, no insurance and no access to care.  Case manager contacted.

## 2015-04-19 NOTE — ED Notes (Signed)
Wanda, case manager, at bedside  

## 2015-04-20 MED ORDER — PROMETHAZINE HCL 25 MG PO TABS: 25.0000 mg | ORAL_TABLET | Freq: Four times a day (QID) | ORAL | Status: DC | PRN

## 2015-04-20 NOTE — Discharge Instructions (Signed)
Flank Pain °Flank pain refers to pain that is located on the side of the body between the upper abdomen and the back. The pain may occur over a short period of time (acute) or may be long-term or reoccurring (chronic). It may be mild or severe. Flank pain can be caused by many things. °CAUSES  °Some of the more common causes of flank pain include: °· Muscle strains.   °· Muscle spasms.   °· A disease of your spine (vertebral disk disease).   °· A lung infection (pneumonia).   °· Fluid around your lungs (pulmonary edema).   °· A kidney infection.   °· Kidney stones.   °· A very painful skin rash caused by the chickenpox virus (shingles).   °· Gallbladder disease.   °HOME CARE INSTRUCTIONS  °Home care will depend on the cause of your pain. In general, °· Rest as directed by your caregiver. °· Drink enough fluids to keep your urine clear or pale yellow. °· Only take over-the-counter or prescription medicines as directed by your caregiver. Some medicines may help relieve the pain. °· Tell your caregiver about any changes in your pain. °· Follow up with your caregiver as directed. °SEEK IMMEDIATE MEDICAL CARE IF:  °· Your pain is not controlled with medicine.   °· You have new or worsening symptoms. °· Your pain increases.   °· You have abdominal pain.   °· You have shortness of breath.   °· You have persistent nausea or vomiting.   °· You have swelling in your abdomen.   °· You feel faint or pass out.   °· You have blood in your urine. °· You have a fever or persistent symptoms for more than 2-3 days. °· You have a fever and your symptoms suddenly get worse. °MAKE SURE YOU:  °· Understand these instructions. °· Will watch your condition. °· Will get help right away if you are not doing well or get worse. °Document Released: 11/21/2005 Document Revised: 06/24/2012 Document Reviewed: 05/14/2012 °[employer]® Patient Information ©2015 [employer]. This information is not intended to replace advice given to you by your  health care provider. Make sure you discuss any questions you have with your health care provider. ° °

## 2015-04-21 LAB — URINE CULTURE

## 2016-01-17 ENCOUNTER — Encounter (HOSPITAL_COMMUNITY): Payer: Self-pay | Admitting: Emergency Medicine

## 2016-01-17 ENCOUNTER — Emergency Department (HOSPITAL_COMMUNITY)
Admission: EM | Admit: 2016-01-17 | Discharge: 2016-01-18 | Disposition: A | Discharge status: Home / Self Care | Payer: Self-pay | Attending: Emergency Medicine | Admitting: Emergency Medicine

## 2016-01-17 DIAGNOSIS — N898 Other specified noninflammatory disorders of vagina: Secondary | ICD-10-CM | POA: Insufficient documentation

## 2016-01-17 DIAGNOSIS — R110 Nausea: Secondary | ICD-10-CM | POA: Insufficient documentation

## 2016-01-17 DIAGNOSIS — Z3202 Encounter for pregnancy test, result negative: Secondary | ICD-10-CM | POA: Insufficient documentation

## 2016-01-17 DIAGNOSIS — Z79899 Other long term (current) drug therapy: Secondary | ICD-10-CM | POA: Insufficient documentation

## 2016-01-17 DIAGNOSIS — Z87448 Personal history of other diseases of urinary system: Secondary | ICD-10-CM | POA: Insufficient documentation

## 2016-01-17 DIAGNOSIS — Z8719 Personal history of other diseases of the digestive system: Secondary | ICD-10-CM | POA: Insufficient documentation

## 2016-01-17 DIAGNOSIS — G8929 Other chronic pain: Secondary | ICD-10-CM | POA: Insufficient documentation

## 2016-01-17 DIAGNOSIS — F17210 Nicotine dependence, cigarettes, uncomplicated: Secondary | ICD-10-CM | POA: Insufficient documentation

## 2016-01-17 DIAGNOSIS — Z9851 Tubal ligation status: Secondary | ICD-10-CM | POA: Insufficient documentation

## 2016-01-17 DIAGNOSIS — R1011 Right upper quadrant pain: Secondary | ICD-10-CM | POA: Insufficient documentation

## 2016-01-17 LAB — CBC
HEMATOCRIT: 42.2 % (ref 36.0–46.0)
HEMOGLOBIN: 15.2 g/dL — AB (ref 12.0–15.0)
MCH: 32.7 pg (ref 26.0–34.0)
MCHC: 36 g/dL (ref 30.0–36.0)
MCV: 90.8 fL (ref 78.0–100.0)
Platelets: 263 10*3/uL (ref 150–400)
RBC: 4.65 MIL/uL (ref 3.87–5.11)
RDW: 12.3 % (ref 11.5–15.5)
WBC: 8.6 10*3/uL (ref 4.0–10.5)

## 2016-01-17 LAB — COMPREHENSIVE METABOLIC PANEL
ALK PHOS: 71 U/L (ref 38–126)
ALT: 13 U/L — ABNORMAL LOW (ref 14–54)
ANION GAP: 8 (ref 5–15)
AST: 17 U/L (ref 15–41)
Albumin: 4.1 g/dL (ref 3.5–5.0)
BUN: 17 mg/dL (ref 6–20)
CO2: 24 mmol/L (ref 22–32)
Calcium: 8.8 mg/dL — ABNORMAL LOW (ref 8.9–10.3)
Chloride: 105 mmol/L (ref 101–111)
Creatinine, Ser: 0.73 mg/dL (ref 0.44–1.00)
GFR calc non Af Amer: >60 mL/min (ref >60)
Glucose, Bld: 114 mg/dL — ABNORMAL HIGH (ref 65–99)
POTASSIUM: 3.5 mmol/L (ref 3.5–5.1)
SODIUM: 137 mmol/L (ref 135–145)
TOTAL PROTEIN: 7 g/dL (ref 6.5–8.1)
Total Bilirubin: 0.7 mg/dL (ref 0.3–1.2)

## 2016-01-17 LAB — LIPASE, BLOOD: Lipase: 24 U/L (ref 11–51)

## 2016-01-17 NOTE — ED Notes (Signed)
Pt states she would like to be checked for STDs while she is here

## 2016-01-17 NOTE — ED Notes (Signed)
Pt states she has been having abd pain for the past couple months  Pt states the pain is in the right side under her rib  Pt states it is a squeezing pain  Pt states she has had pain in her kidneys as well   Pt has nausea without vomiting

## 2016-01-18 ENCOUNTER — Emergency Department (HOSPITAL_COMMUNITY): Payer: Self-pay

## 2016-01-18 LAB — WET PREP, GENITAL
Sperm: NONE SEEN
Trich, Wet Prep: NONE SEEN
YEAST WET PREP: NONE SEEN

## 2016-01-18 LAB — POC URINE PREG, ED: Preg Test, Ur: NEGATIVE

## 2016-01-18 LAB — URINALYSIS, ROUTINE W REFLEX MICROSCOPIC
Bilirubin Urine: NEGATIVE
Glucose, UA: NEGATIVE mg/dL
HGB URINE DIPSTICK: NEGATIVE
Ketones, ur: NEGATIVE mg/dL
Nitrite: NEGATIVE
PROTEIN: NEGATIVE mg/dL
Specific Gravity, Urine: 1.018 (ref 1.005–1.030)
pH: 6 (ref 5.0–8.0)

## 2016-01-18 LAB — URINE MICROSCOPIC-ADD ON

## 2016-01-18 LAB — GC/CHLAMYDIA PROBE AMP (~~LOC~~) NOT AT ARMC
Chlamydia: NEGATIVE
NEISSERIA GONORRHEA: NEGATIVE

## 2016-01-18 MED ORDER — DICYCLOMINE HCL 20 MG PO TABS: 20.0000 mg | ORAL_TABLET | Freq: Two times a day (BID) | ORAL | Status: DC

## 2016-01-18 MED ORDER — KETOROLAC TROMETHAMINE 30 MG/ML IJ SOLN
30.0000 mg | Freq: Once | INTRAMUSCULAR | Status: AC
  Administered 2016-01-18: 30 mg via INTRAVENOUS
  Filled 2016-01-18: qty 1

## 2016-01-18 MED ORDER — PROMETHAZINE HCL 25 MG PO TABS: 25.0000 mg | ORAL_TABLET | Freq: Four times a day (QID) | ORAL | Status: DC | PRN

## 2016-01-18 MED ORDER — HALOPERIDOL LACTATE 5 MG/ML IJ SOLN
2.0000 mg | Freq: Once | INTRAMUSCULAR | Status: AC
  Administered 2016-01-18: 2 mg via INTRAVENOUS
  Filled 2016-01-18: qty 1

## 2016-01-18 MED ORDER — DICYCLOMINE HCL 10 MG/ML IM SOLN
20.0000 mg | Freq: Once | INTRAMUSCULAR | Status: AC
  Administered 2016-01-18: 20 mg via INTRAMUSCULAR
  Filled 2016-01-18: qty 2

## 2016-01-18 MED ORDER — DICYCLOMINE HCL 20 MG PO TABS
20.0000 mg | ORAL_TABLET | Freq: Two times a day (BID) | ORAL | Status: DC
Start: 2016-01-18 — End: 2017-11-24

## 2016-01-18 NOTE — ED Notes (Signed)
Pt verbalized understanding of f/u instructions. Pt ambulatory to WR, NAD

## 2016-01-18 NOTE — ED Notes (Signed)
Writer went in pt room to set up for pelvic.  Pt was eating gummy bears and teddy grahams and had a bottle of water on the table.  When ask if she was told it was ok to eat, she responded " I just wanted to eat something, but  "she" said drink the water so I can give urine sample."  Pt ask not to eat anything else, diet order is currently NPO, so candy and crackers were put away but both packages were almost empty.  RN notified.

## 2016-01-18 NOTE — Discharge Instructions (Signed)

## 2016-01-18 NOTE — ED Notes (Signed)
Pelvic supplies at bedside. 

## 2016-01-18 NOTE — ED Provider Notes (Signed)
CSN: [id number]     Arrival date & time 01/17/16  2201 History   First MD Initiated Contact with Patient 01/18/16 0004     Chief Complaint  Patient presents with  . Abdominal Pain     (Consider location/radiation/quality/duration/timing/severity/associated sxs/prior Treatment) HPI Comments: 40 year old female with a history of fibromyalgia, endometriosis, interstitial cystitis, and IBS presents to the ED for evaluation of abdominal pain. Patient reports chronic abdominal pain for "many months". She states that she had worsening pain a few months ago in her right upper quadrant. She reports the pain is constant and waxing and waning in severity. She has tried over-the-counter medications and 2 pain pills that she got from a friend without relief. She states that she has nausea "constantly". She reports a slight weight loss because of decreased oral intake secondary to nausea. She has had no fever, chills, pleuritic chest pain, vomiting, diarrhea, melena, hematochezia, dysuria, or hematuria. Abdominal surgical history notable for tubal ligation and surgery for endometriosis. Patient is also concerned about STIs as she engaged in unprotected sexual intercourse with an ex-boyfriend. She has had some mild, yellow discharge.  Patient is a 40 y.o. female presenting with abdominal pain. The history is provided by the patient. No language interpreter was used.  Abdominal Pain Associated symptoms: nausea and vaginal discharge   Associated symptoms: no chest pain, no dysuria, no fever, no hematuria, no shortness of breath and no vomiting     Past Medical History  Diagnosis Date  . Fibromyalgia   . Endometriosis   . Interstitial cystitis   . Irritable bowel syndrome (IBS)   . Tobacco dependence 10/30/2014   Past Surgical History  Procedure Laterality Date  . Endometreosis    . Tubal ligation     Family History  Problem Relation Age of Onset  . Cancer Other    Social History  Substance Use  Topics  . Smoking status: Current Every Day Smoker -- 1.50 packs/day    Types: Cigarettes  . Smokeless tobacco: None  . Alcohol Use: Yes   OB History    No data available      Review of Systems  Constitutional: Negative for fever.  Respiratory: Negative for shortness of breath.   Cardiovascular: Negative for chest pain.  Gastrointestinal: Positive for nausea and abdominal pain. Negative for vomiting.  Genitourinary: Positive for vaginal discharge. Negative for dysuria and hematuria.  All other systems reviewed and are negative.   Allergies  Review of patient's allergies indicates no known allergies.  Home Medications   Prior to Admission medications   Medication Sig Start Date End Date Taking? Authorizing Provider  Chlorphen-PE-Acetaminophen (SINUS CONGESTION/PAIN NIGHT PO) Take 2 tablets by mouth at bedtime as needed (congestion).   Yes Historical Provider, MD  guaifenesin (ROBITUSSIN) 100 MG/5ML syrup Take 200 mg by mouth 3 (three) times daily as needed for cough or congestion.   Yes Historical Provider, MD  ibuprofen (ADVIL,MOTRIN) 200 MG tablet Take 800 mg by mouth every 6 (six) hours as needed for moderate pain.   Yes Historical Provider, MD  PRESCRIPTION MEDICATION Take 1 tablet by mouth once.   Yes Historical Provider, MD  PRESCRIPTION MEDICATION Take 1 tablet by mouth once.   Yes Historical Provider, MD  cephALEXin (KEFLEX) 500 MG capsule Take 1 capsule (500 mg total) by mouth every 12 (twelve) hours. Patient not taking: Reported on 11/12/2014 11/02/14   Rhetta MuraJai-Gurmukh Samtani, MD  cyclobenzaprine (FLEXERIL) 10 MG tablet Take 1 tablet (10 mg total) by mouth 2 (two)  times daily as needed for muscle spasms. Patient not taking: Reported on 04/19/2015 11/03/14   Rhetta Mura, MD  dicyclomine (BENTYL) 20 MG tablet Take 1 tablet (20 mg total) by mouth 2 (two) times daily. 01/18/16   Antony Madura, PA-C  gabapentin (NEURONTIN) 600 MG tablet Take 1 tablet (600 mg total) by mouth  daily. Patient not taking: Reported on 04/19/2015 11/02/14   Rhetta Mura, MD  HYDROmorphone (DILAUDID) 2 MG tablet Take 1-2 tablets (2-4 mg total) by mouth every 4 (four) hours as needed for severe pain. Patient not taking: Reported on 04/19/2015 11/12/14   Dione Booze, MD  naproxen (NAPROSYN) 500 MG tablet Take 1 tablet (500 mg total) by mouth 2 (two) times daily. Patient not taking: Reported on 04/19/2015 11/12/14   Dione Booze, MD  nicotine (NICODERM CQ - DOSED IN MG/24 HOURS) 21 mg/24hr patch Place 1 patch (21 mg total) onto the skin daily. Patient not taking: Reported on 11/12/2014 11/02/14   Rhetta Mura, MD  oxyCODONE (OXY IR/ROXICODONE) 5 MG immediate release tablet Take 2 tablets (10 mg total) by mouth every 6 (six) hours as needed for severe pain. Patient not taking: Reported on 04/19/2015 11/02/14   Rhetta Mura, MD  predniSONE (DELTASONE) 20 MG tablet Take 3 tablets (60 mg total) by mouth daily. Patient not taking: Reported on 04/19/2015 11/12/14   Dione Booze, MD  promethazine (PHENERGAN) 25 MG tablet Take 1 tablet (25 mg total) by mouth every 6 (six) hours as needed for nausea or vomiting. 01/18/16   Antony Madura, PA-C   BP 113/65 mmHg  Pulse 60  Temp(Src) 98 F (36.7 C) (Oral)  Resp 18  Ht  (1.676 m)  Wt 54.432 kg  BMI 19.38 kg/m2  SpO2 95%  LMP 01/11/2016 (Approximate)   Physical Exam  Constitutional: She is oriented to person, place, and time. She appears well-developed and well-nourished. No distress.  Patient appears uncomfortable and mildly agitated  HENT:  Head: Normocephalic and atraumatic.  Eyes: Conjunctivae and EOM are normal. No scleral icterus.  Neck: Normal range of motion.  Cardiovascular: Normal rate, regular rhythm and intact distal pulses.   Pulmonary/Chest: Effort normal. No respiratory distress. She has no wheezes.  Respirations even and unlabored  Abdominal: Soft. She exhibits no distension. There is tenderness. There is no rebound.   Abdomen soft. Focal tenderness noted in the right upper quadrant. Negative Murphy sign. There is mild voluntary guarding. No rigidity, masses, or peritoneal signs.  Musculoskeletal: Normal range of motion.  Neurological: She is alert and oriented to person, place, and time. She exhibits normal muscle tone. Coordination normal.  Patient moving all extremities  Skin: Skin is warm and dry. No rash noted. She is not diaphoretic. No erythema. No pallor.  Psychiatric: She has a normal mood and affect. Her behavior is normal.  Nursing note and vitals reviewed.   ED Course  Procedures (including critical care time) Labs Review Labs Reviewed  WET PREP, GENITAL - Abnormal; Notable for the following:    Clue Cells Wet Prep HPF POC FEW (*)    WBC, Wet Prep HPF POC RARE (*)    All other components within normal limits  COMPREHENSIVE METABOLIC PANEL - Abnormal; Notable for the following:    Glucose, Bld 114 (*)    Calcium 8.8 (*)    ALT 13 (*)    All other components within normal limits  CBC - Abnormal; Notable for the following:    Hemoglobin 15.2 (*)    All other  components within normal limits  URINALYSIS, ROUTINE W REFLEX MICROSCOPIC (NOT AT [facility]) - Abnormal; Notable for the following:    APPearance CLOUDY (*)    Leukocytes, UA SMALL (*)    All other components within normal limits  URINE MICROSCOPIC-ADD ON - Abnormal; Notable for the following:    Squamous Epithelial / LPF 0-5 (*)    Bacteria, UA MANY (*)    All other components within normal limits  URINE CULTURE  LIPASE, BLOOD  POC URINE PREG, ED  GC/CHLAMYDIA PROBE AMP ([place]) NOT AT [facility]    Imaging Review US Abdomen Complete  01/18/2016  CLINICAL DATA:  Abdominal pain for 2 months. EXAM: ABDOMEN ULTRASOUND COMPLETE COMPARISON:  None. FINDINGS: Gallbladder: No gallstones or wall thickening visualized. No sonographic Murphy sign noted by sonographer. Common bile duct: Diameter: 2.9 mm Liver: No focal lesion identified.  Within normal limits in parenchymal echogenicity. IVC: No abnormality visualized. Pancreas: Visualized portion unremarkable. Spleen: Size and appearance within normal limits. Right Kidney: Length: 11.2 cm. Echogenicity within normal limits. No mass or hydronephrosis visualized. Left Kidney: Length: 10.7 cm. Echogenicity within normal limits. No mass or hydronephrosis visualized. Abdominal aorta: No aneurysm visualized. Other findings: None. IMPRESSION: No significant abnormality. Electronically Signed   By: Ellery Plunk M.D.   On: 01/18/2016 01:40     I have personally reviewed and evaluated these images and lab results as part of my medical decision-making.   EKG Interpretation None      3:14 AM Patient reevaluated regarding pain control. Patient sleeping comfortably in exam bed, in no distress. UA pending. Work up, thus far, is noncontributory.  4:00 AM Patient remains sleeping, in no distress. She has been awoken to review laboratory and US findings. Patient verbalizes understanding of results. Given chronicity of symptoms, I do not believe further emergent workup is indicated. Patient is afebrile and hemodynamically stable. Will refer to gastroenterology and provided prescriptions for Bentyl and Phenergan. Return precautions given at discharge.  MDM   Final diagnoses:  Abdominal pain, chronic, right upper quadrant    40 year old female presents to the emergency department for evaluation of worsening chronic right upper quadrant abdominal pain. She states that she was previously evaluated for symptoms "many months ago". It appears as though the patient presented for similar complaints in July 2016. Pain has been constant since this time. Patient is afebrile and hemodynamically stable. She has a noncontributory laboratory workup. Ultrasound obtained which is negative for gallstones. Patient has a normal common bile duct. No liver changes identified on ultrasound. No imaging findings to  explain cause of patient's symptoms.  On reexamination, patient found to be sleeping comfortably. She is in no distress. Given chronicity of symptoms with reassuring testing, I do not believe further emergent workup is indicated at this time. Patient will be discharged with referral to gastroenterology. Patient prescribed Phenergan for nausea as needed and Bentyl for pain. Return precautions discussed and provided. Patient discharged in satisfactory condition; VSS.   Filed Vitals:   01/17/16 2226 01/18/16 0139 01/18/16 0431  BP: 117/89 104/78 113/65  Pulse: 85 63 60  Temp: 98 F (36.7 C)  98 F (36.7 C)  TempSrc: Oral  Oral  Resp: Height:  (1.676 m)    Weight: 54.432 kg    SpO2: 96% 99% 95%       Antony Madura, PA-C 01/18/16 1610  Gilda Crease, MD 01/18/16 [phone]

## 2016-01-18 NOTE — ED Notes (Signed)
PA-C at bedside 

## 2016-01-20 LAB — URINE CULTURE: Culture: >=100000 — AB

## 2016-01-21 ENCOUNTER — Telehealth: Payer: Self-pay

## 2016-01-21 NOTE — Telephone Encounter (Signed)
Post ED Visit - Positive Culture Follow-up  Culture report reviewed by antimicrobial stewardship pharmacist:  []  Enzo BiNathan Batchelder, Pharm.D. []  Celedonio MiyamotoJeremy Frens, Pharm.D., BCPS []  Garvin FilaMike Maccia, Pharm.D. []  Georgina PillionElizabeth Martin, Pharm.D., BCPS []  [place] NebagamonMinh Pham, [address]Ph[address]D., BCPS, AAHIVP []  Estella HuskMichelle Turner, Pharm.D., BCPS, AAHIVP []  Tennis Mustassie Stewart, Pharm.D. []  Sherle Poeob Vincent, [address]Ph[address] Leone HavenAubrey Jones Pharm D Positive urine culture  No treatment and no further patient follow-up is required at this time.  Jerry CarasCullom, Anay Walter Burnett 01/21/2016, 1:39 PM

## 2016-02-23 ENCOUNTER — Emergency Department (HOSPITAL_COMMUNITY): Payer: MEDICAID

## 2016-02-23 ENCOUNTER — Emergency Department (HOSPITAL_COMMUNITY)
Admission: EM | Admit: 2016-02-23 | Discharge: 2016-02-23 | Disposition: A | Discharge status: Home / Self Care | Payer: MEDICAID | Attending: Emergency Medicine | Admitting: Emergency Medicine

## 2016-02-23 ENCOUNTER — Encounter (HOSPITAL_COMMUNITY): Payer: Self-pay | Admitting: *Deleted

## 2016-02-23 DIAGNOSIS — F17210 Nicotine dependence, cigarettes, uncomplicated: Secondary | ICD-10-CM | POA: Insufficient documentation

## 2016-02-23 DIAGNOSIS — S0990XA Unspecified injury of head, initial encounter: Secondary | ICD-10-CM

## 2016-02-23 DIAGNOSIS — Y999 Unspecified external cause status: Secondary | ICD-10-CM | POA: Insufficient documentation

## 2016-02-23 DIAGNOSIS — Z7982 Long term (current) use of aspirin: Secondary | ICD-10-CM | POA: Insufficient documentation

## 2016-02-23 DIAGNOSIS — Y939 Activity, unspecified: Secondary | ICD-10-CM | POA: Insufficient documentation

## 2016-02-23 DIAGNOSIS — Y929 Unspecified place or not applicable: Secondary | ICD-10-CM | POA: Insufficient documentation

## 2016-02-23 DIAGNOSIS — Z79899 Other long term (current) drug therapy: Secondary | ICD-10-CM | POA: Insufficient documentation

## 2016-02-23 MED ORDER — CYCLOBENZAPRINE HCL 10 MG PO TABS
10.0000 mg | ORAL_TABLET | Freq: Once | ORAL | Status: AC
  Administered 2016-02-23: 10 mg via ORAL
  Filled 2016-02-23: qty 1

## 2016-02-23 MED ORDER — IBUPROFEN 800 MG PO TABS
800.0000 mg | ORAL_TABLET | Freq: Three times a day (TID) | ORAL | Status: DC
Start: 2016-02-23 — End: 2017-11-24

## 2016-02-23 MED ORDER — TRAMADOL HCL 50 MG PO TABS: 50.0000 mg | ORAL_TABLET | Freq: Two times a day (BID) | ORAL | Status: DC | PRN

## 2016-02-23 MED ORDER — TRAMADOL HCL 50 MG PO TABS
50.0000 mg | ORAL_TABLET | Freq: Once | ORAL | Status: AC
  Administered 2016-02-23: 50 mg via ORAL
  Filled 2016-02-23: qty 1

## 2016-02-23 MED ORDER — CYCLOBENZAPRINE HCL 10 MG PO TABS
10.0000 mg | ORAL_TABLET | Freq: Two times a day (BID) | ORAL | Status: DC | PRN
Start: 2016-02-23 — End: 2017-11-24
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
## 2016-04-14 ENCOUNTER — Emergency Department (HOSPITAL_COMMUNITY)
Admission: EM | Admit: 2016-04-14 | Discharge: 2016-04-15 | Disposition: A | Discharge status: Home / Self Care | Payer: Self-pay | Attending: Emergency Medicine | Admitting: Emergency Medicine

## 2016-04-14 ENCOUNTER — Emergency Department (HOSPITAL_COMMUNITY): Payer: Self-pay

## 2016-04-14 ENCOUNTER — Encounter (HOSPITAL_COMMUNITY): Payer: Self-pay | Admitting: Emergency Medicine

## 2016-04-14 DIAGNOSIS — Y929 Unspecified place or not applicable: Secondary | ICD-10-CM | POA: Insufficient documentation

## 2016-04-14 DIAGNOSIS — G8929 Other chronic pain: Secondary | ICD-10-CM

## 2016-04-14 DIAGNOSIS — Z7982 Long term (current) use of aspirin: Secondary | ICD-10-CM | POA: Insufficient documentation

## 2016-04-14 DIAGNOSIS — M542 Cervicalgia: Secondary | ICD-10-CM

## 2016-04-14 DIAGNOSIS — Y939 Activity, unspecified: Secondary | ICD-10-CM | POA: Insufficient documentation

## 2016-04-14 DIAGNOSIS — F17210 Nicotine dependence, cigarettes, uncomplicated: Secondary | ICD-10-CM | POA: Insufficient documentation

## 2016-04-14 DIAGNOSIS — S20229A Contusion of unspecified back wall of thorax, initial encounter: Secondary | ICD-10-CM | POA: Insufficient documentation

## 2016-04-14 DIAGNOSIS — M25521 Pain in right elbow: Secondary | ICD-10-CM

## 2016-04-14 DIAGNOSIS — S300XXA Contusion of lower back and pelvis, initial encounter: Secondary | ICD-10-CM | POA: Insufficient documentation

## 2016-04-14 DIAGNOSIS — Y999 Unspecified external cause status: Secondary | ICD-10-CM | POA: Insufficient documentation

## 2016-04-14 DIAGNOSIS — R51 Headache: Secondary | ICD-10-CM | POA: Insufficient documentation

## 2016-04-14 LAB — I-STAT CHEM 8, ED
BUN: 11 mg/dL (ref 6–20)
CALCIUM ION: 1.11 mmol/L — AB (ref 1.13–1.30)
Chloride: 107 mmol/L (ref 101–111)
Creatinine, Ser: 0.6 mg/dL (ref 0.44–1.00)
Glucose, Bld: 80 mg/dL (ref 65–99)
HCT: 39 % (ref 36.0–46.0)
HEMOGLOBIN: 13.3 g/dL (ref 12.0–15.0)
Potassium: 3.7 mmol/L (ref 3.5–5.1)
SODIUM: 143 mmol/L (ref 135–145)
TCO2: 25 mmol/L (ref 0–100)

## 2016-04-14 MED ORDER — METOCLOPRAMIDE HCL 5 MG/ML IJ SOLN
5.0000 mg | Freq: Once | INTRAMUSCULAR | Status: AC
  Administered 2016-04-14: 5 mg via INTRAVENOUS
  Filled 2016-04-14: qty 2

## 2016-04-14 MED ORDER — SODIUM CHLORIDE 0.9 % IV BOLUS (SEPSIS)
1000.0000 mL | Freq: Once | INTRAVENOUS | Status: AC
  Administered 2016-04-14: 1000 mL via INTRAVENOUS

## 2016-04-14 MED ORDER — KETOROLAC TROMETHAMINE 15 MG/ML IJ SOLN
15.0000 mg | Freq: Once | INTRAMUSCULAR | Status: AC
  Administered 2016-04-14: 15 mg via INTRAVENOUS
  Filled 2016-04-14: qty 1

## 2016-04-14 MED ORDER — HYDROCODONE-ACETAMINOPHEN 5-325 MG PO TABS
1.0000 | ORAL_TABLET | Freq: Once | ORAL | Status: AC
  Administered 2016-04-14: 1 via ORAL
  Filled 2016-04-14: qty 1

## 2016-04-14 MED ORDER — DIPHENHYDRAMINE HCL 50 MG/ML IJ SOLN
12.5000 mg | Freq: Once | INTRAMUSCULAR | Status: AC
  Administered 2016-04-14: 12.5 mg via INTRAVENOUS
  Filled 2016-04-14: qty 1

## 2016-04-14 MED ORDER — KETOROLAC TROMETHAMINE 15 MG/ML IJ SOLN
15.0000 mg | Freq: Once | INTRAMUSCULAR | Status: AC
Start: 2016-04-14 — End: 2016-04-14
  Administered 2016-04-14: 15 mg via INTRAVENOUS
  Filled 2016-04-14: qty 1
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
## 2016-04-14 NOTE — Discharge Instructions (Signed)
All of your imaging today including the CT scan of your head were negative for anything concerning. Follow up with a primary care provider or the health department within 3 days to be reevaluated. Take OTC ibuprofen or Tylenol as needed for pain. Use ice on your elbow and wrist to help with swelling and pain. Keep your abrasions clean and dry and cover them with antibiotic ointment and a clean bandage once a day.  Return to emergency department if you experience worsening pain, worsening headache, visual changes, nausea, vomiting, loss of bowel bladder function abdominal pain, blood in your urine or fevers.  General Headache Without Cause A headache is pain or discomfort felt around the head or neck area. The specific cause of a headache may not be found. There are many causes and types of headaches. A few common ones are:  Tension headaches.  Migraine headaches.  Cluster headaches.  Chronic daily headaches. HOME CARE INSTRUCTIONS  Watch your condition for any changes. Take these steps to help with your condition: Managing Pain  Take over-the-counter and prescription medicines only as told by your health care provider.  Lie down in a dark, quiet room when you have a headache.  If directed, apply ice to the head and neck area:  Put ice in a plastic bag.  Place a towel between your skin and the bag.  Leave the ice on for 20 minutes, 2-3 times per day.  Use a heating pad or hot shower to apply heat to the head and neck area as told by your health care provider.  Keep lights dim if bright lights bother you or make your headaches worse. Eating and Drinking  Eat meals on a regular schedule.  Limit alcohol use.  Decrease the amount of caffeine you drink, or stop drinking caffeine. General Instructions  Keep all follow-up visits as told by your health care provider. This is important.  Keep a headache journal to help find out what may trigger your headaches. For example, write  down:  What you eat and drink.  How much sleep you get.  Any change to your diet or medicines.  Try massage or other relaxation techniques.  Limit stress.  Sit up straight, and do not tense your muscles.  Do not use tobacco products, including cigarettes, chewing tobacco, or e-cigarettes. If you need help quitting, ask your health care provider.  Exercise regularly as told by your health care provider.  Sleep on a regular schedule. Get 7-9 hours of sleep, or the amount recommended by your health care provider. SEEK MEDICAL CARE IF:   Your symptoms are not helped by medicine.  You have a headache that is different from the usual headache.  You have nausea or you vomit.  You have a fever. SEEK IMMEDIATE MEDICAL CARE IF:   Your headache becomes severe.  You have repeated vomiting.  You have a stiff neck.  You have a loss of vision.  You have problems with speech.  You have pain in the eye or ear.  You have muscular weakness or loss of muscle control.  You lose your balance or have trouble walking.  You feel faint or pass out.  You have confusion.   This information is not intended to replace advice given to you by your health care provider. Make sure you discuss any questions you have with your health care provider.   Document Released: 09/30/2005 Document Revised: 06/21/2015 Document Reviewed: 01/23/2015 Elsevier Interactive Patient Education 2016 [employer]  Joint Pain  Joint pain, which is also called arthralgia, can be caused by many things. Joint pain often goes away when you follow your health care provider's instructions for relieving pain at home. However, joint pain can also be caused by conditions that require further treatment. Common causes of joint pain include:  Bruising in the area of the joint.  Overuse of the joint.  Wear and tear on the joints that occur with aging (osteoarthritis).  Various other forms of arthritis.  A buildup of a  crystal form of uric acid in the joint (gout).  Infections of the joint (septic arthritis) or of the bone (osteomyelitis). Your health care provider may recommend medicine to help with the pain. If your joint pain continues, additional tests may be needed to diagnose your condition. HOME CARE INSTRUCTIONS Watch your condition for any changes. Follow these instructions as directed to lessen the pain that you are feeling.  Take medicines only as directed by your health care provider.  Rest the affected area for as long as your health care provider says that you should. If directed to do so, raise the painful joint above the level of your heart while you are sitting or lying down.  Do not do things that cause or worsen pain.  If directed, apply ice to the painful area:  Put ice in a plastic bag.  Place a towel between your skin and the bag.  Leave the ice on for 20 minutes, 2-3 times per day.  Wear an elastic bandage, splint, or sling as directed by your health care provider. Loosen the elastic bandage or splint if your fingers or toes become numb and tingle, or if they turn cold and blue.  Begin exercising or stretching the affected area as directed by your health care provider. Ask your health care provider what types of exercise are safe for you.  Keep all follow-up visits as directed by your health care provider. This is important. SEEK MEDICAL CARE IF:  Your pain increases, and medicine does not help.  Your joint pain does not improve within 3 days.  You have increased bruising or swelling.  You have a fever.  You lose 10 lb (4.5 kg) or more without trying. SEEK IMMEDIATE MEDICAL CARE IF:  You are not able to move the joint.  Your fingers or toes become numb or they turn cold and blue.   This information is not intended to replace advice given to you by your health care provider. Make sure you discuss any questions you have with your health care provider.   Document  Released: 09/30/2005 Document Revised: 10/21/2014 Document Reviewed: 07/12/2014 Elsevier Interactive Patient Education [employer]
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
## 2016-04-15 NOTE — ED Notes (Addendum)
Patient is homeless, and is discharged. She will be staying in the lobby until the morning when Social Work is available to assist patient in finding a shelter.  Permission for patient to stay/sleep in lobby til morning for SW consult given by Elliot GurneyWoody, Glass blower/designerN - Charge Nurse.
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
## 2017-11-24 ENCOUNTER — Encounter (HOSPITAL_COMMUNITY): Payer: Self-pay

## 2017-11-24 ENCOUNTER — Emergency Department (HOSPITAL_COMMUNITY): Payer: Self-pay

## 2017-11-24 ENCOUNTER — Other Ambulatory Visit: Payer: Self-pay

## 2017-11-24 ENCOUNTER — Emergency Department (HOSPITAL_COMMUNITY)
Admission: EM | Admit: 2017-11-24 | Discharge: 2017-11-24 | Disposition: A | Discharge status: Home / Self Care | Payer: Self-pay | Attending: Emergency Medicine | Admitting: Emergency Medicine

## 2017-11-24 DIAGNOSIS — F17210 Nicotine dependence, cigarettes, uncomplicated: Secondary | ICD-10-CM | POA: Insufficient documentation

## 2017-11-24 DIAGNOSIS — R102 Pelvic and perineal pain: Secondary | ICD-10-CM | POA: Insufficient documentation

## 2017-11-24 DIAGNOSIS — Z79899 Other long term (current) drug therapy: Secondary | ICD-10-CM | POA: Insufficient documentation

## 2017-11-24 DIAGNOSIS — R109 Unspecified abdominal pain: Secondary | ICD-10-CM

## 2017-11-24 LAB — URINALYSIS, ROUTINE W REFLEX MICROSCOPIC
BILIRUBIN URINE: NEGATIVE
GLUCOSE, UA: NEGATIVE mg/dL
HGB URINE DIPSTICK: NEGATIVE
KETONES UR: 5 mg/dL — AB
Leukocytes, UA: NEGATIVE
Nitrite: NEGATIVE
PH: 7 (ref 5.0–8.0)
Protein, ur: NEGATIVE mg/dL
SPECIFIC GRAVITY, URINE: 1.01 (ref 1.005–1.030)

## 2017-11-24 LAB — BASIC METABOLIC PANEL
Anion gap: 10 (ref 5–15)
BUN: 14 mg/dL (ref 6–20)
CHLORIDE: 105 mmol/L (ref 101–111)
CO2: 25 mmol/L (ref 22–32)
CREATININE: 0.75 mg/dL (ref 0.44–1.00)
Calcium: 8.8 mg/dL — ABNORMAL LOW (ref 8.9–10.3)
GFR calc Af Amer: >60 mL/min (ref >60)
GFR calc non Af Amer: >60 mL/min (ref >60)
GLUCOSE: 94 mg/dL (ref 65–99)
Potassium: 3.7 mmol/L (ref 3.5–5.1)
SODIUM: 140 mmol/L (ref 135–145)

## 2017-11-24 LAB — CBC WITH DIFFERENTIAL/PLATELET
Basophils Absolute: 0 10*3/uL (ref 0.0–0.1)
Basophils Relative: 0 %
EOS ABS: 0.2 10*3/uL (ref 0.0–0.7)
EOS PCT: 2 %
HCT: 44.2 % (ref 36.0–46.0)
Hemoglobin: 14.7 g/dL (ref 12.0–15.0)
LYMPHS ABS: 1.3 10*3/uL (ref 0.7–4.0)
Lymphocytes Relative: 12 %
MCH: 31.7 pg (ref 26.0–34.0)
MCHC: 33.3 g/dL (ref 30.0–36.0)
MCV: 95.3 fL (ref 78.0–100.0)
MONO ABS: 0.5 10*3/uL (ref 0.1–1.0)
Monocytes Relative: 5 %
Neutro Abs: 8.9 10*3/uL — ABNORMAL HIGH (ref 1.7–7.7)
Neutrophils Relative %: 81 %
PLATELETS: 287 10*3/uL (ref 150–400)
RBC: 4.64 MIL/uL (ref 3.87–5.11)
RDW: 13.1 % (ref 11.5–15.5)
WBC: 11 10*3/uL — AB (ref 4.0–10.5)

## 2017-11-24 LAB — HEPATIC FUNCTION PANEL
ALT: 17 U/L (ref 14–54)
AST: 28 U/L (ref 15–41)
Albumin: 3.9 g/dL (ref 3.5–5.0)
Alkaline Phosphatase: 56 U/L (ref 38–126)
Bilirubin, Direct: <0.1 mg/dL — ABNORMAL LOW (ref 0.1–0.5)
TOTAL PROTEIN: 6.9 g/dL (ref 6.5–8.1)
Total Bilirubin: 0.8 mg/dL (ref 0.3–1.2)

## 2017-11-24 LAB — I-STAT BETA HCG BLOOD, ED (MC, WL, AP ONLY)

## 2017-11-24 MED ORDER — IOPAMIDOL (ISOVUE-300) INJECTION 61%
100.0000 mL | Freq: Once | INTRAVENOUS | Status: AC | PRN
  Administered 2017-11-24: 100 mL via INTRAVENOUS

## 2017-11-24 MED ORDER — KETOROLAC TROMETHAMINE 30 MG/ML IJ SOLN
30.0000 mg | Freq: Once | INTRAMUSCULAR | Status: AC
  Administered 2017-11-24: 30 mg via INTRAVENOUS
  Filled 2017-11-24: qty 1

## 2017-11-24 MED ORDER — SODIUM CHLORIDE 0.9 % IV BOLUS (SEPSIS)
1000.0000 mL | Freq: Once | INTRAVENOUS | Status: AC
  Administered 2017-11-24: 1000 mL via INTRAVENOUS

## 2017-11-24 MED ORDER — IOPAMIDOL (ISOVUE-300) INJECTION 61%
INTRAVENOUS | Status: AC
  Filled 2017-11-24: qty 100

## 2017-11-24 MED ORDER — MORPHINE SULFATE (PF) 2 MG/ML IV SOLN
2.0000 mg | Freq: Once | INTRAVENOUS | Status: AC
  Administered 2017-11-24: 2 mg via INTRAVENOUS
  Filled 2017-11-24: qty 1

## 2017-11-24 NOTE — ED Provider Notes (Signed)
Patient placed in Quick Look pathway, seen and evaluated   Chief Complaint: right sided pelvic pain and flank pain.  HPI:   Pt has hx of intestitial cystitis and has had R flank pain x several weeks, but new RLQ abd pain that started today.   ROS: pelvic pain.  Physical Exam:   Gen: No distress  Neuro: Awake and Alert  Skin: Warm    Focused Exam: right sided abd tenderness with guarding, generalized pain.   Initiation of care has begun. The patient has been counseled on the process, plan, and necessity for staying for the completion/evaluation, and the remainder of the medical screening examination    Derwood KaplanNanavati, Dillon Livermore, MD 11/24/17 1331

## 2017-11-24 NOTE — ED Provider Notes (Signed)
Patient care [facility] shift [facility]-C pending CT abdomen pelvis.  Presented with right lower quadrant pain with negative ultrasound. Mildly elevated white count and labs otherwise unremarkable. Plan to discharge home with over-the-counter analgesia if CT negative.  Patient's pain has been managed in the emergency department. Pending hepatic function labs CT abdomen and pelvis: Negative for any acute abnormalities.  Patient will be discharged home with symptomatic relief and close follow-up.  Discussed strict return precautions and advised to return to the emergency department if experiencing any new or worsening symptoms. Instructions were understood and patient agreed with discharge plan.   Georgiana ShoreMitchell, Evanee Lubrano B, PA-C 11/24/17 1634    Tilden Fossaees, Elizabeth, MD 11/25/17 [phone]

## 2017-11-24 NOTE — Discharge Instructions (Addendum)
Take tylenol or ibuprofen as needed for pain. Follow up with surgery center if pain persists.

## 2017-11-24 NOTE — ED Notes (Signed)
Pt had drawn for labs:  Red Gold Blue Lt green Lavender Dark green x2

## 2017-11-24 NOTE — ED Provider Notes (Signed)
[facility]-EMERGENCY DEPT Provider Note   CSN: [id number] Arrival date & time: 11/24/17  1232     History   Chief Complaint Chief Complaint  Patient presents with  . Abdominal Pain  . Back Pain  . Vaginal Discharge  . Dysuria    HPI Kristin Patterson is a 42 y.o. female with a past medical history of IBS, interstitial cystitis, fibromyalgia, endometriosis, who presents to ED for evaluation of right sided abdominal pain that began this morning.  She describes the pain as sharp and radiating down her leg.  She has been having intermittent bilateral flank pain for the past 2 weeks, which she states "feels like my kidney infection." She took an unknown antibiotic (starts with a C, possibly ciprofloxacin) given by a friend but the pain persists. She took ibuprofen and her home dose of gabapentin with no improvement in her symptoms.  He denies any dysuria, hematuria, nausea, vomiting, diarrhea, constipation, vaginal discharge, fevers, suspicious food ingestions. Prior abdominal surgeries include tubal ligation, surgery for endometriosis both over 17 years ago.  HPI  Past Medical History:  Diagnosis Date  . Endometriosis   . Fibromyalgia   . Interstitial cystitis   . Irritable bowel syndrome (IBS)   . Tobacco dependence 10/30/2014    Patient Active Problem List   Diagnosis Date Noted  . Anemia 11/01/2014  . Acute pyelonephritis: Bilateral 10/30/2014  . Leukocytosis 10/30/2014  . Flu-like symptoms 10/30/2014  . UTI (lower urinary tract infection) 10/30/2014  . Hypokalemia 10/30/2014  . Tobacco dependence 10/30/2014    Past Surgical History:  Procedure Laterality Date  . endometreosis    . TUBAL LIGATION      OB History    No data available       Home Medications    Prior to Admission medications   Medication Sig Start Date End Date Taking? Authorizing Provider  gabapentin (NEURONTIN) 600 MG tablet Take 1 tablet (600 mg total) by mouth  daily. Patient taking differently: Take 600 mg by mouth 2 (two) times daily.  11/02/14  Yes Rhetta Mura, MD  ibuprofen (ADVIL,MOTRIN) 200 MG tablet Take 800-1,000 mg by mouth every 6 (six) hours as needed (pain).   Yes [provider]    Family History Family History  Problem Relation Age of Onset  . Cancer Other     Social History Social History   Tobacco Use  . Smoking status: Current Every Day Smoker    Packs/day: 1.00    Types: Cigarettes  . Smokeless tobacco: Never Used  Substance Use Topics  . Alcohol use: Yes    Comment: rarely  . Drug use: No    Comment: former user     Allergies   Patient has no known allergies.   Review of Systems Review of Systems  Constitutional: Negative for appetite change, chills and fever.  HENT: Negative for ear pain, rhinorrhea, sneezing and sore throat.   Eyes: Negative for photophobia and visual disturbance.  Respiratory: Negative for cough, chest tightness, shortness of breath and wheezing.   Cardiovascular: Negative for chest pain and palpitations.  Gastrointestinal: Positive for abdominal pain. Negative for blood in stool, constipation, diarrhea, nausea and vomiting.  Genitourinary: Positive for flank pain. Negative for dysuria, hematuria and urgency.  Musculoskeletal: Negative for myalgias.  Skin: Negative for rash.  Neurological: Negative for dizziness, weakness and light-headedness.     Physical Exam Updated Vital Signs BP (!) 126/91   Pulse 72   Temp 98.4 F (36.9 C)  Resp 16   Ht 5\' 6"  (1.676 m)   Wt 65.9 kg (145 lb 4 oz)   LMP 11/03/2017   SpO2 97%   BMI 23.44 kg/m   Physical Exam  Constitutional: She appears well-developed and well-nourished. No distress.  Nontoxic appearing and in no acute distress.  HENT:  Head: Normocephalic and atraumatic.  Nose: Nose normal.  Eyes: Conjunctivae and EOM are normal. Left eye exhibits no discharge. No scleral icterus.  Neck: Normal range of motion.  Neck supple.  Cardiovascular: Normal rate, regular rhythm, normal heart sounds and intact distal pulses. Exam reveals no gallop and no friction rub.  No murmur heard. Pulmonary/Chest: Effort normal and breath sounds normal. No respiratory distress.  Abdominal: Soft. Bowel sounds are normal. She exhibits no distension. There is tenderness. There is guarding and tenderness at McBurney's point.    Bilateral CVA tenderness.  Musculoskeletal: Normal range of motion. She exhibits no edema.  Neurological: She is alert. She exhibits normal muscle tone. Coordination normal.  Skin: Skin is warm and dry. No rash noted.  Psychiatric: She has a normal mood and affect.  Nursing note and vitals reviewed.    ED Treatments / Results  Labs (all labs ordered are listed, but only abnormal results are displayed) Labs Reviewed  BASIC METABOLIC PANEL - Abnormal; Notable for the following components:      Result Value   Calcium 8.8 (*)    All other components within normal limits  CBC WITH DIFFERENTIAL/PLATELET - Abnormal; Notable for the following components:   WBC 11.0 (*)    Neutro Abs 8.9 (*)    All other components within normal limits  URINALYSIS, ROUTINE W REFLEX MICROSCOPIC - Abnormal; Notable for the following components:   APPearance HAZY (*)    Ketones, ur 5 (*)    All other components within normal limits  HEPATIC FUNCTION PANEL  I-STAT BETA HCG BLOOD, ED ([facility], [facility], [facility] ONLY)    EKG  EKG Interpretation None       Radiology US Abdomen Complete  Result Date: 11/24/2017 CLINICAL DATA:  Acute right flank pain. History of renal infections. EXAM: ABDOMINAL ULTRASOUND TRANSABDOMINAL AND TRANSVAGINAL ULTRASOUND OF PELVIS DOPPLER ULTRASOUND OF OVARIES TECHNIQUE: Complete abdominal sonography was performed. Both transabdominal and transvaginal ultrasound examinations of the pelvis were performed. Transabdominal technique was performed for global imaging of the pelvis including uterus, ovaries,  adnexal regions, and pelvic cul-de-sac. It was necessary to proceed with endovaginal exam following the transabdominal exam to visualize the ovaries. Color and duplex Doppler ultrasound was utilized to evaluate blood flow to the ovaries. COMPARISON:  CT abdomen 10/30/2014; abdominal ultrasound from 01/18/2016 FINDINGS: Gallbladder: No gallstones or wall thickening visualized. No sonographic Murphy sign noted by sonographer. Common bile duct: Diameter: 7 mm Liver: No focal lesion identified. Within normal limits in parenchymal echogenicity. Portal vein is patent on color Doppler imaging with normal direction of blood flow towards the liver. IVC: No abnormality visualized. Pancreas: Visualized portion unremarkable. Parts of the pancreatic head were not well seen due to overlying bowel gas. Spleen: Size and appearance within normal limits. Right Kidney: Length: 11.6 cm. Echogenicity within normal limits. No mass or hydronephrosis visualized. Left Kidney: Length: 11.9 cm. Echogenicity within normal limits. No mass or hydronephrosis visualized. Abdominal aorta: No aneurysm visualized. Other findings: None. Uterus Measurements: 10.2 by 5.6 by 5.7 cm. No fibroids or other mass visualized. Endometrium Thickness: 12 mm.  No focal abnormality visualized. Right ovary Measurements: 3.0 by 1.7 by 2.8 cm. There is  a 1.4 by 0.8 by 0.8 cm cyst or follicle in the right ovary. Normal appearance/no adnexal mass. Left ovary Measurements: 4.1 by 2.9 by 3.4 cm. There is a 2.0 by 2.5 by 1.9 cm (volume = 5 cm^3) cyst of the left ovary with relatively simple appearance. Normal appearance/no adnexal mass. Pulsed Doppler evaluation of both ovaries demonstrates normal low-resistance arterial and venous waveforms. Other findings No abnormal free fluid. IMPRESSION: CT abdomen: 1. Extrahepatic biliary dilatation with the common bile duct measuring 0.7 cm in diameter, previously 0.4 cm by my measurements on 01/18/2016. No directly visualized  choledocholithiasis. No appreciable gallstones in the gallbladder. CT pelvis: 1. There is a small (5 cc in volume) simple appearing cyst of the left ovary which does not require any further workup. Otherwise normal appearance of the pelvis. Electronically Signed   By: Gaylyn RongWalter  Liebkemann M.D.   On: 11/24/2017 15:02   [place] Pelvis Transvanginal Non-ob (tv Only)  Result Date: 11/24/2017 CLINICAL DATA:  Acute right flank pain. History of renal infections. EXAM: ABDOMINAL ULTRASOUND TRANSABDOMINAL AND TRANSVAGINAL ULTRASOUND OF PELVIS DOPPLER ULTRASOUND OF OVARIES TECHNIQUE: Complete abdominal sonography was performed. Both transabdominal and transvaginal ultrasound examinations of the pelvis were performed. Transabdominal technique was performed for global imaging of the pelvis including uterus, ovaries, adnexal regions, and pelvic cul-de-sac. It was necessary to proceed with endovaginal exam following the transabdominal exam to visualize the ovaries. Color and duplex Doppler ultrasound was utilized to evaluate blood flow to the ovaries. COMPARISON:  CT abdomen 10/30/2014; abdominal ultrasound from 01/18/2016 FINDINGS: Gallbladder: No gallstones or wall thickening visualized. No sonographic Murphy sign noted by sonographer. Common bile duct: Diameter: 7 mm Liver: No focal lesion identified. Within normal limits in parenchymal echogenicity. Portal vein is patent on color Doppler imaging with normal direction of blood flow towards the liver. IVC: No abnormality visualized. Pancreas: Visualized portion unremarkable. Parts of the pancreatic head were not well seen due to overlying bowel gas. Spleen: Size and appearance within normal limits. Right Kidney: Length: 11.6 cm. Echogenicity within normal limits. No mass or hydronephrosis visualized. Left Kidney: Length: 11.9 cm. Echogenicity within normal limits. No mass or hydronephrosis visualized. Abdominal aorta: No aneurysm visualized. Other findings: None. Uterus  Measurements: 10.2 by 5.6 by 5.7 cm. No fibroids or other mass visualized. Endometrium Thickness: 12 mm.  No focal abnormality visualized. Right ovary Measurements: 3.0 by 1.7 by 2.8 cm. There is a 1.4 by 0.8 by 0.8 cm cyst or follicle in the right ovary. Normal appearance/no adnexal mass. Left ovary Measurements: 4.1 by 2.9 by 3.4 cm. There is a 2.0 by 2.5 by 1.9 cm (volume = 5 cm^3) cyst of the left ovary with relatively simple appearance. Normal appearance/no adnexal mass. Pulsed Doppler evaluation of both ovaries demonstrates normal low-resistance arterial and venous waveforms. Other findings No abnormal free fluid. IMPRESSION: CT abdomen: 1. Extrahepatic biliary dilatation with the common bile duct measuring 0.7 cm in diameter, previously 0.4 cm by my measurements on 01/18/2016. No directly visualized choledocholithiasis. No appreciable gallstones in the gallbladder. CT pelvis: 1. There is a small (5 cc in volume) simple appearing cyst of the left ovary which does not require any further workup. Otherwise normal appearance of the pelvis. Electronically Signed   By: Gaylyn RongWalter  Liebkemann M.D.   On: 11/24/2017 15:02   [place] Pelvis (transabdominal Only)  Result Date: 11/24/2017 CLINICAL DATA:  Acute right flank pain. History of renal infections. EXAM: ABDOMINAL ULTRASOUND TRANSABDOMINAL AND TRANSVAGINAL ULTRASOUND OF PELVIS DOPPLER ULTRASOUND OF OVARIES TECHNIQUE: Complete  abdominal sonography was performed. Both transabdominal and transvaginal ultrasound examinations of the pelvis were performed. Transabdominal technique was performed for global imaging of the pelvis including uterus, ovaries, adnexal regions, and pelvic cul-de-sac. It was necessary to proceed with endovaginal exam following the transabdominal exam to visualize the ovaries. Color and duplex Doppler ultrasound was utilized to evaluate blood flow to the ovaries. COMPARISON:  CT abdomen 10/30/2014; abdominal ultrasound from 01/18/2016 FINDINGS:  Gallbladder: No gallstones or wall thickening visualized. No sonographic Murphy sign noted by sonographer. Common bile duct: Diameter: 7 mm Liver: No focal lesion identified. Within normal limits in parenchymal echogenicity. Portal vein is patent on color Doppler imaging with normal direction of blood flow towards the liver. IVC: No abnormality visualized. Pancreas: Visualized portion unremarkable. Parts of the pancreatic head were not well seen due to overlying bowel gas. Spleen: Size and appearance within normal limits. Right Kidney: Length: 11.6 cm. Echogenicity within normal limits. No mass or hydronephrosis visualized. Left Kidney: Length: 11.9 cm. Echogenicity within normal limits. No mass or hydronephrosis visualized. Abdominal aorta: No aneurysm visualized. Other findings: None. Uterus Measurements: 10.2 by 5.6 by 5.7 cm. No fibroids or other mass visualized. Endometrium Thickness: 12 mm.  No focal abnormality visualized. Right ovary Measurements: 3.0 by 1.7 by 2.8 cm. There is a 1.4 by 0.8 by 0.8 cm cyst or follicle in the right ovary. Normal appearance/no adnexal mass. Left ovary Measurements: 4.1 by 2.9 by 3.4 cm. There is a 2.0 by 2.5 by 1.9 cm (volume = 5 cm^3) cyst of the left ovary with relatively simple appearance. Normal appearance/no adnexal mass. Pulsed Doppler evaluation of both ovaries demonstrates normal low-resistance arterial and venous waveforms. Other findings No abnormal free fluid. IMPRESSION: CT abdomen: 1. Extrahepatic biliary dilatation with the common bile duct measuring 0.7 cm in diameter, previously 0.4 cm by my measurements on 01/18/2016. No directly visualized choledocholithiasis. No appreciable gallstones in the gallbladder. CT pelvis: 1. There is a small (5 cc in volume) simple appearing cyst of the left ovary which does not require any further workup. Otherwise normal appearance of the pelvis. Electronically Signed   By: Gaylyn Rong M.D.   On: 11/24/2017 15:02   US  Pelvic Doppler (torsion R/o Or Mass Arterial Flow)  Result Date: 11/24/2017 CLINICAL DATA:  Acute right flank pain. History of renal infections. EXAM: ABDOMINAL ULTRASOUND TRANSABDOMINAL AND TRANSVAGINAL ULTRASOUND OF PELVIS DOPPLER ULTRASOUND OF OVARIES TECHNIQUE: Complete abdominal sonography was performed. Both transabdominal and transvaginal ultrasound examinations of the pelvis were performed. Transabdominal technique was performed for global imaging of the pelvis including uterus, ovaries, adnexal regions, and pelvic cul-de-sac. It was necessary to proceed with endovaginal exam following the transabdominal exam to visualize the ovaries. Color and duplex Doppler ultrasound was utilized to evaluate blood flow to the ovaries. COMPARISON:  CT abdomen 10/30/2014; abdominal ultrasound from 01/18/2016 FINDINGS: Gallbladder: No gallstones or wall thickening visualized. No sonographic Murphy sign noted by sonographer. Common bile duct: Diameter: 7 mm Liver: No focal lesion identified. Within normal limits in parenchymal echogenicity. Portal vein is patent on color Doppler imaging with normal direction of blood flow towards the liver. IVC: No abnormality visualized. Pancreas: Visualized portion unremarkable. Parts of the pancreatic head were not well seen due to overlying bowel gas. Spleen: Size and appearance within normal limits. Right Kidney: Length: 11.6 cm. Echogenicity within normal limits. No mass or hydronephrosis visualized. Left Kidney: Length: 11.9 cm. Echogenicity within normal limits. No mass or hydronephrosis visualized. Abdominal aorta: No aneurysm visualized. Other findings:  None. Uterus Measurements: 10.2 by 5.6 by 5.7 cm. No fibroids or other mass visualized. Endometrium Thickness: 12 mm.  No focal abnormality visualized. Right ovary Measurements: 3.0 by 1.7 by 2.8 cm. There is a 1.4 by 0.8 by 0.8 cm cyst or follicle in the right ovary. Normal appearance/no adnexal mass. Left ovary Measurements: 4.1  by 2.9 by 3.4 cm. There is a 2.0 by 2.5 by 1.9 cm (volume = 5 cm^3) cyst of the left ovary with relatively simple appearance. Normal appearance/no adnexal mass. Pulsed Doppler evaluation of both ovaries demonstrates normal low-resistance arterial and venous waveforms. Other findings No abnormal free fluid. IMPRESSION: CT abdomen: 1. Extrahepatic biliary dilatation with the common bile duct measuring 0.7 cm in diameter, previously 0.4 cm by my measurements on 01/18/2016. No directly visualized choledocholithiasis. No appreciable gallstones in the gallbladder. CT pelvis: 1. There is a small (5 cc in volume) simple appearing cyst of the left ovary which does not require any further workup. Otherwise normal appearance of the pelvis. Electronically Signed   By: Gaylyn Rong M.D.   On: 11/24/2017 15:02    Procedures Procedures (including critical care time)  Medications Ordered in ED Medications  morphine 2 MG/ML injection 2 mg (not administered)  iopamidol (ISOVUE-300) 61 % injection 100 mL (not administered)  ketorolac (TORADOL) 30 MG/ML injection 30 mg (30 mg Intravenous Given 11/24/17 1516)  sodium chloride 0.9 % bolus 1,000 mL (1,000 mLs Intravenous New Bag/Given 11/24/17 1516)     Initial Impression / Assessment and Plan / ED Course  I have reviewed the triage vital signs and the nursing notes.  Pertinent labs & imaging results that were available during my care of the patient were reviewed by me and considered in my medical decision making (see chart for details).  Clinical Course as of Nov 24 1553  Mon Nov 24, 2017  1357 U/s in room when I went in for my initial evaluation. Will assess after completion. Patient appears NAD, resting comfortably.  [HK]    Clinical Course User Index [HK] Dietrich Pates, PA-C    Patient presents to ED for evaluation of acute onset R-sided abdominal pain that began this morning. She has been having bilateral flank pain for the past 2 weeks, that has been  intermittent. Denies vaginal discharge, fever. She has a history of interstitial cystitis, pyelonephritis in the past. She has taken a few doses of antibiotics given by her friend, as well as ibuprofen, gabapentin but her symptoms persist. She is afebrile. She is tender on the R side of the abdomen, including the RLQ over McBurney's point. Also RUQ tenderness. Labs showing mild leukocytosis at 11. Remainder of lab work unremarkable. UA with no evidence of UTI, although she could have self-treated this with the antibiotics she took. U/S shows no R sided pelvic pathology, but shows CBD duct dilatation, increased by 0.3cm from previous study 2 years ago. She is concerned that there is "something else going on." Will check LFTs, obtain CT abdomen/pelvis to evaluate for other possible causes of discomfort, including appendicitis. Will sign out to oncoming provider, Antony Madura PA-C, pending labwork, imaging. I anticipate dispo home with appropriate outpatient f/u, OTC pain medications if remainder of work up is unremarkable.  Portions of this note were generated with Scientist, clinical (histocompatibility and immunogenetics). Dictation errors may occur despite best attempts at proofreading.   Final Clinical Impressions(s) / ED Diagnoses   Final diagnoses:  Pain, pelvic, female  Abdominal wall pain    ED Discharge Orders  None       Dietrich Pates, PA-C 11/24/17 1605    Wynetta Fines, MD 11/25/17 1002

## 2018-07-05 IMAGING — US US ABDOMEN COMPLETE
1 series · 13 of 25 positions shown · non-contrast
Comparison: CT abdomen 10/30/2014; abdominal ultrasound from
01/18/2016

CLINICAL DATA: Acute right flank pain. History of renal infections.

EXAM:
ABDOMINAL ULTRASOUND
TRANSABDOMINAL AND TRANSVAGINAL ULTRASOUND OF PELVIS
DOPPLER ULTRASOUND OF OVARIES
TECHNIQUE: Complete abdominal sonography was performed.

[Series 1: us abdomen complete · 0.20mm/px · 13 of 93 slices shown]
[im 1/93]
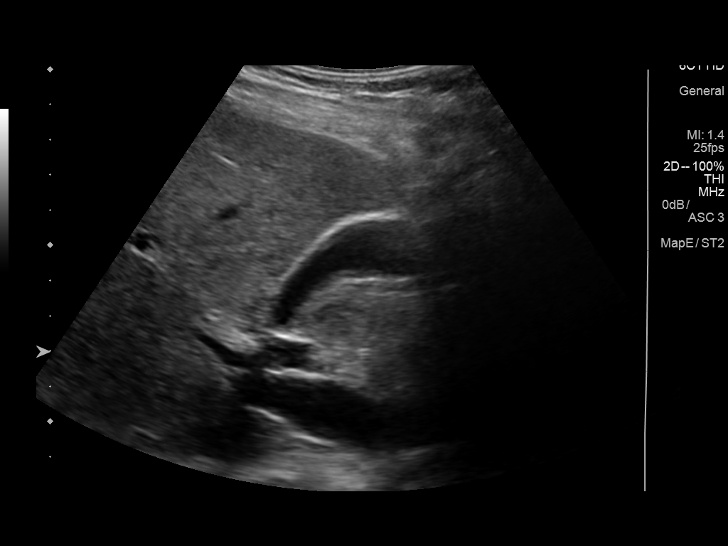
[im 8/93]
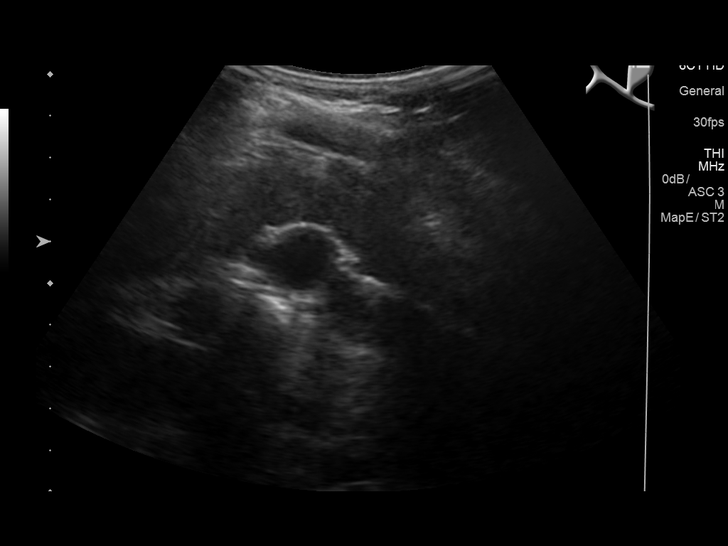
[im 16/93]
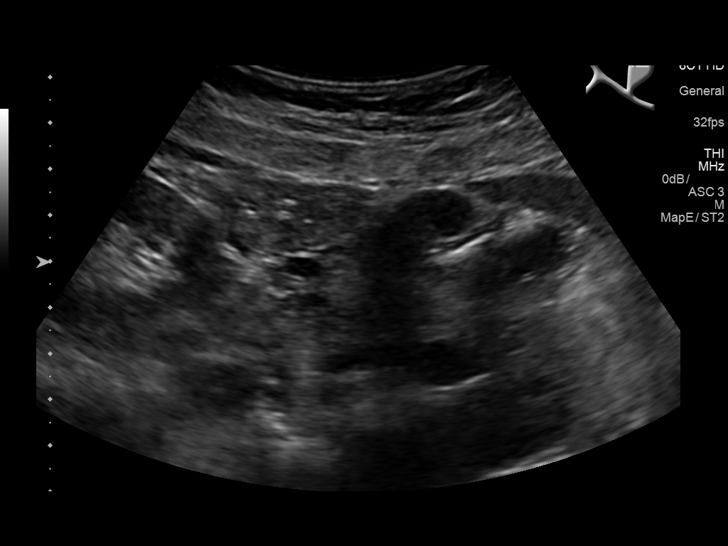
[im 24/93]
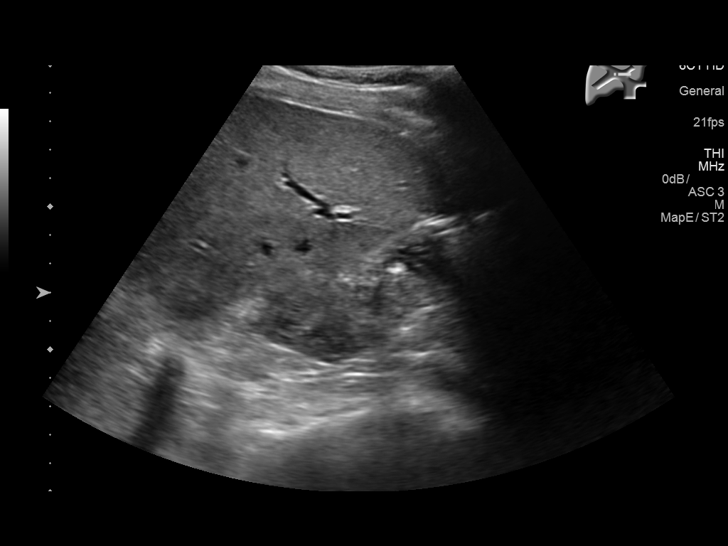
[im 31/93]
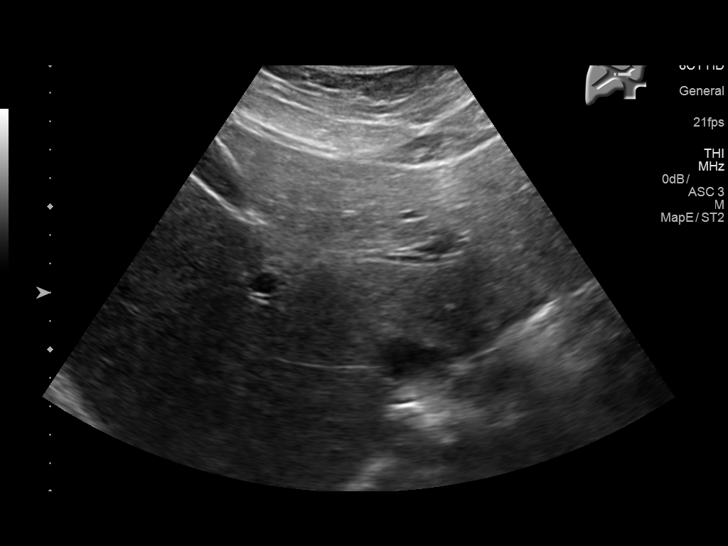
[im 39/93]
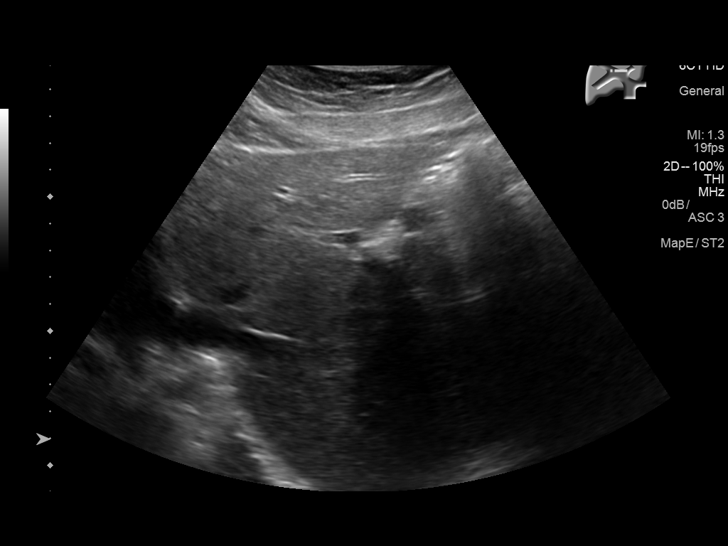
[im 47/93]
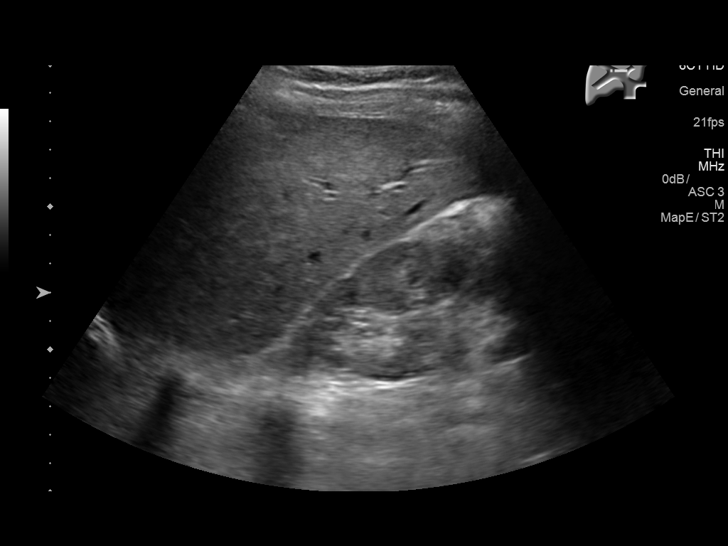
[im 54/93]
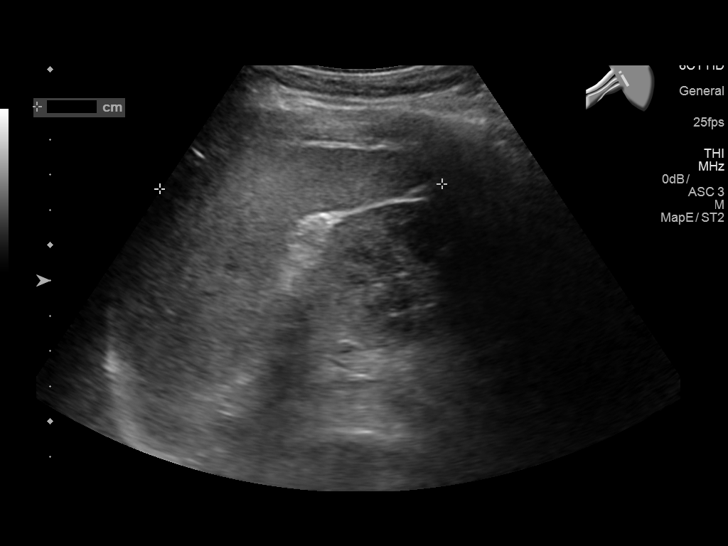
[im 62/93]
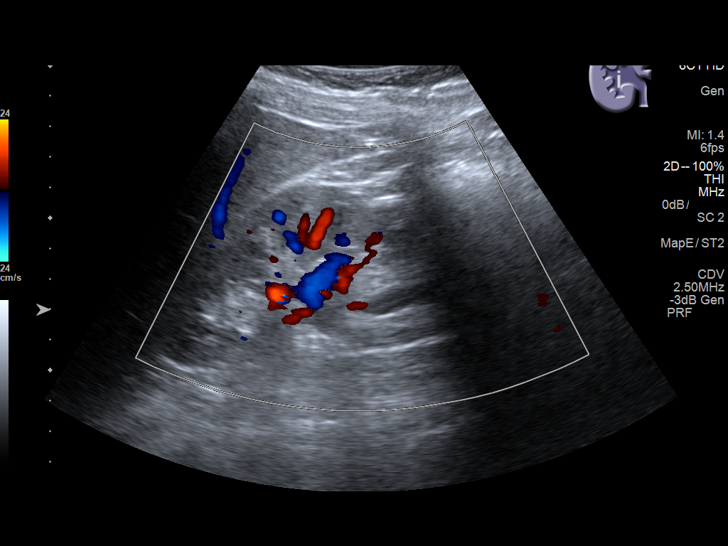
[im 70/93]
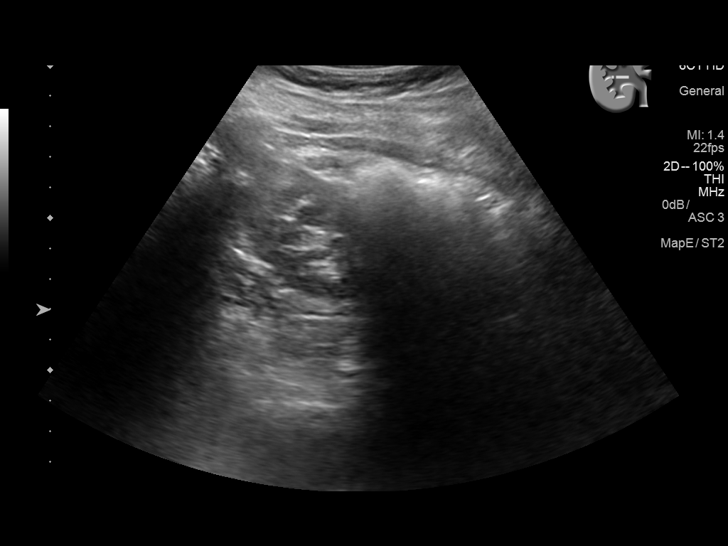
[im 77/93]
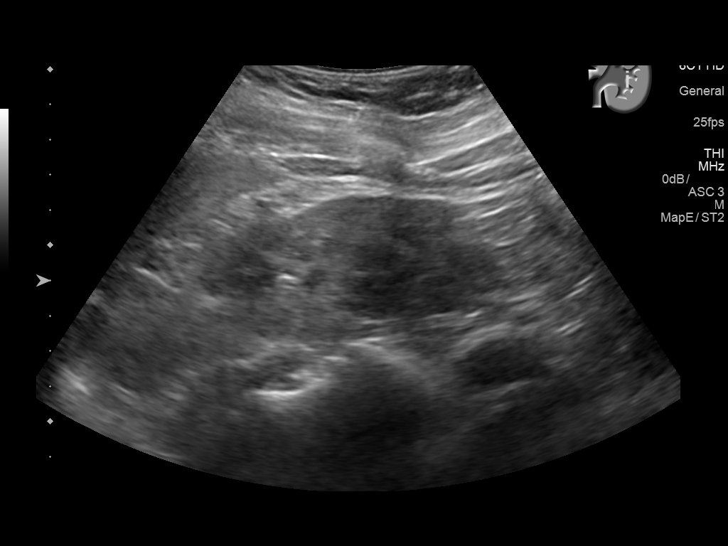
[im 85/93]
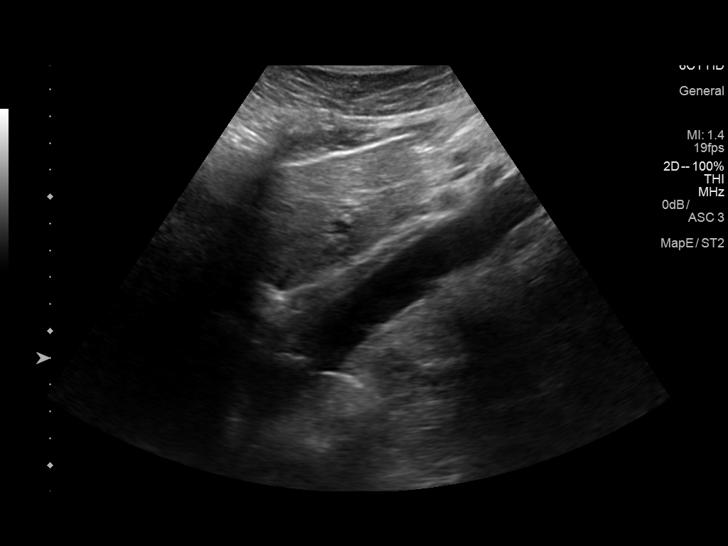
[im 93/93]
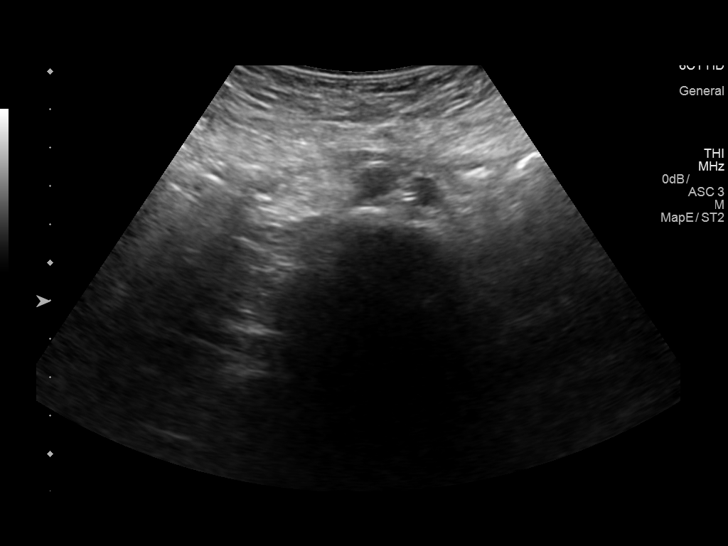

[13 of 25 positions shown; findings below may reference images not displayed]

Both transabdominal and transvaginal ultrasound examinations of the
pelvis were performed. Transabdominal technique was performed for
global imaging of the pelvis including uterus, ovaries, adnexal
regions, and pelvic cul-de-sac.

It was necessary to proceed with endovaginal exam following the
transabdominal exam to visualize the ovaries. Color and duplex
Doppler ultrasound was utilized to evaluate blood flow to the
ovaries.
FINDINGS: Gallbladder: No gallstones or wall thickening visualized. No
sonographic Murphy sign noted by sonographer.

Common bile duct: Diameter: 7 mm

Liver: No focal lesion identified. Within normal limits in
parenchymal echogenicity. Portal vein is patent on color Doppler
imaging with normal direction of blood flow towards the liver.

IVC: No abnormality visualized.

Pancreas: Visualized portion unremarkable. Parts of the pancreatic
head were not well seen due to overlying bowel gas.

Spleen: Size and appearance within normal limits.

Right Kidney: Length: 11.6 cm. Echogenicity within normal limits. No
mass or hydronephrosis visualized.

Left Kidney: Length: 11.9 cm. Echogenicity within normal limits. No
mass or hydronephrosis visualized.

Abdominal aorta: No aneurysm visualized.

Other findings: None.

Uterus

Measurements: 10.2 by 5.6 by 5.7 cm. No fibroids or other mass
visualized.

Endometrium

Thickness: 12 mm.  No focal abnormality visualized.

Right ovary

Measurements: 3.0 by 1.7 by 2.8 cm. There is a 1.4 by 0.8 by 0.8 cm
cyst or follicle in the right ovary.. Normal appearance/no adnexal
mass.

Left ovary

Measurements: 4.1 by 2.9 by 3.4 cm. There is a 2.0 by 2.5 by 1.9 cm
(volume = 5 cm^3) cyst of the left ovary with relatively simple
appearance.. Normal appearance/no adnexal mass.

Pulsed Doppler evaluation of both ovaries demonstrates normal
low-resistance arterial and venous waveforms.

Other findings

No abnormal free fluid.
IMPRESSION: CT abdomen:

1. Extrahepatic biliary dilatation with the common bile duct
measuring 0.7 cm in diameter, previously 0.4 cm by my measurements
on 01/18/2016. No directly visualized choledocholithiasis. No
appreciable gallstones in the gallbladder.

CT pelvis:

1. There is a small (5 cc in volume) simple appearing cyst of the
left ovary which does not require any further workup. Otherwise
normal appearance of the pelvis.
